# Patient Record
Sex: Female | Born: 1940 | Race: White | Hispanic: No | Marital: Married | Smoking: Former smoker
Health system: Southern US, Community
[De-identification: ages and names within clinical notes are randomized; demographics above are authoritative.]

## PROBLEM LIST (undated history)

## (undated) DIAGNOSIS — I82409 Acute embolism and thrombosis of unspecified deep veins of unspecified lower extremity: Secondary | ICD-10-CM

## (undated) DIAGNOSIS — E079 Disorder of thyroid, unspecified: Secondary | ICD-10-CM

## (undated) DIAGNOSIS — E7800 Pure hypercholesterolemia, unspecified: Secondary | ICD-10-CM

## (undated) DIAGNOSIS — M316 Other giant cell arteritis: Secondary | ICD-10-CM

## (undated) HISTORY — PX: ABDOMINAL HYSTERECTOMY: SHX81

## (undated) HISTORY — PX: OTHER SURGICAL HISTORY: SHX169

## (undated) HISTORY — DX: Other giant cell arteritis: M31.6

## (undated) HISTORY — PX: TOE SURGERY: SHX1073

## (undated) HISTORY — PX: MOUTH SURGERY: SHX715

## (undated) HISTORY — DX: Disorder of thyroid, unspecified: E07.9

## (undated) HISTORY — PX: SKIN CANCER DESTRUCTION: SHX778

## (undated) HISTORY — PX: KIDNEY SURGERY: SHX687

## (undated) HISTORY — DX: Pure hypercholesterolemia, unspecified: E78.00

## (undated) HISTORY — PX: BREAST BIOPSY: SHX20

---

## 1968-12-20 HISTORY — PX: NEPHRECTOMY: SHX65

## 2016-01-07 DIAGNOSIS — N952 Postmenopausal atrophic vaginitis: Secondary | ICD-10-CM | POA: Diagnosis not present

## 2016-01-07 DIAGNOSIS — I8390 Asymptomatic varicose veins of unspecified lower extremity: Secondary | ICD-10-CM | POA: Diagnosis not present

## 2016-02-26 DIAGNOSIS — Z6828 Body mass index (BMI) 28.0-28.9, adult: Secondary | ICD-10-CM | POA: Diagnosis not present

## 2016-02-26 DIAGNOSIS — I10 Essential (primary) hypertension: Secondary | ICD-10-CM | POA: Diagnosis not present

## 2016-02-26 DIAGNOSIS — E039 Hypothyroidism, unspecified: Secondary | ICD-10-CM | POA: Diagnosis not present

## 2016-03-29 DIAGNOSIS — L570 Actinic keratosis: Secondary | ICD-10-CM | POA: Diagnosis not present

## 2016-03-29 DIAGNOSIS — L821 Other seborrheic keratosis: Secondary | ICD-10-CM | POA: Diagnosis not present

## 2016-03-29 DIAGNOSIS — Z08 Encounter for follow-up examination after completed treatment for malignant neoplasm: Secondary | ICD-10-CM | POA: Diagnosis not present

## 2016-03-29 DIAGNOSIS — Z85828 Personal history of other malignant neoplasm of skin: Secondary | ICD-10-CM | POA: Diagnosis not present

## 2016-04-20 DIAGNOSIS — G4700 Insomnia, unspecified: Secondary | ICD-10-CM | POA: Diagnosis not present

## 2016-04-20 DIAGNOSIS — I10 Essential (primary) hypertension: Secondary | ICD-10-CM | POA: Diagnosis not present

## 2016-04-20 DIAGNOSIS — Z6828 Body mass index (BMI) 28.0-28.9, adult: Secondary | ICD-10-CM | POA: Diagnosis not present

## 2016-04-23 DIAGNOSIS — I10 Essential (primary) hypertension: Secondary | ICD-10-CM | POA: Diagnosis not present

## 2016-04-23 DIAGNOSIS — E039 Hypothyroidism, unspecified: Secondary | ICD-10-CM | POA: Diagnosis not present

## 2016-05-24 DIAGNOSIS — Z1211 Encounter for screening for malignant neoplasm of colon: Secondary | ICD-10-CM | POA: Diagnosis not present

## 2016-05-24 DIAGNOSIS — Z01419 Encounter for gynecological examination (general) (routine) without abnormal findings: Secondary | ICD-10-CM | POA: Diagnosis not present

## 2016-06-08 DIAGNOSIS — M25511 Pain in right shoulder: Secondary | ICD-10-CM | POA: Diagnosis not present

## 2016-06-16 DIAGNOSIS — M25511 Pain in right shoulder: Secondary | ICD-10-CM | POA: Diagnosis not present

## 2016-06-30 DIAGNOSIS — M7581 Other shoulder lesions, right shoulder: Secondary | ICD-10-CM | POA: Diagnosis not present

## 2016-07-21 DIAGNOSIS — M25511 Pain in right shoulder: Secondary | ICD-10-CM | POA: Diagnosis not present

## 2016-07-23 DIAGNOSIS — M25511 Pain in right shoulder: Secondary | ICD-10-CM | POA: Diagnosis not present

## 2016-07-27 DIAGNOSIS — M25511 Pain in right shoulder: Secondary | ICD-10-CM | POA: Diagnosis not present

## 2016-07-29 DIAGNOSIS — M25511 Pain in right shoulder: Secondary | ICD-10-CM | POA: Diagnosis not present

## 2016-08-03 DIAGNOSIS — L578 Other skin changes due to chronic exposure to nonionizing radiation: Secondary | ICD-10-CM | POA: Diagnosis not present

## 2016-08-03 DIAGNOSIS — Z85828 Personal history of other malignant neoplasm of skin: Secondary | ICD-10-CM | POA: Diagnosis not present

## 2016-08-03 DIAGNOSIS — L814 Other melanin hyperpigmentation: Secondary | ICD-10-CM | POA: Diagnosis not present

## 2016-08-03 DIAGNOSIS — L820 Inflamed seborrheic keratosis: Secondary | ICD-10-CM | POA: Diagnosis not present

## 2016-08-03 DIAGNOSIS — Z08 Encounter for follow-up examination after completed treatment for malignant neoplasm: Secondary | ICD-10-CM | POA: Diagnosis not present

## 2016-08-03 DIAGNOSIS — D225 Melanocytic nevi of trunk: Secondary | ICD-10-CM | POA: Diagnosis not present

## 2016-08-03 DIAGNOSIS — L570 Actinic keratosis: Secondary | ICD-10-CM | POA: Diagnosis not present

## 2016-08-05 DIAGNOSIS — M25511 Pain in right shoulder: Secondary | ICD-10-CM | POA: Diagnosis not present

## 2016-08-10 DIAGNOSIS — M25511 Pain in right shoulder: Secondary | ICD-10-CM | POA: Diagnosis not present

## 2016-09-14 DIAGNOSIS — L570 Actinic keratosis: Secondary | ICD-10-CM | POA: Diagnosis not present

## 2016-09-14 DIAGNOSIS — Z85828 Personal history of other malignant neoplasm of skin: Secondary | ICD-10-CM | POA: Diagnosis not present

## 2016-09-14 DIAGNOSIS — L814 Other melanin hyperpigmentation: Secondary | ICD-10-CM | POA: Diagnosis not present

## 2016-09-14 DIAGNOSIS — D225 Melanocytic nevi of trunk: Secondary | ICD-10-CM | POA: Diagnosis not present

## 2016-09-14 DIAGNOSIS — L638 Other alopecia areata: Secondary | ICD-10-CM | POA: Diagnosis not present

## 2016-09-14 DIAGNOSIS — L578 Other skin changes due to chronic exposure to nonionizing radiation: Secondary | ICD-10-CM | POA: Diagnosis not present

## 2016-09-14 DIAGNOSIS — L820 Inflamed seborrheic keratosis: Secondary | ICD-10-CM | POA: Diagnosis not present

## 2016-09-14 DIAGNOSIS — Z08 Encounter for follow-up examination after completed treatment for malignant neoplasm: Secondary | ICD-10-CM | POA: Diagnosis not present

## 2016-09-29 DIAGNOSIS — I10 Essential (primary) hypertension: Secondary | ICD-10-CM | POA: Diagnosis not present

## 2016-09-29 DIAGNOSIS — Z0000 Encounter for general adult medical examination without abnormal findings: Secondary | ICD-10-CM | POA: Diagnosis not present

## 2016-09-29 DIAGNOSIS — E039 Hypothyroidism, unspecified: Secondary | ICD-10-CM | POA: Diagnosis not present

## 2016-09-29 DIAGNOSIS — G4700 Insomnia, unspecified: Secondary | ICD-10-CM | POA: Diagnosis not present

## 2016-10-20 DIAGNOSIS — E039 Hypothyroidism, unspecified: Secondary | ICD-10-CM | POA: Diagnosis not present

## 2016-10-20 DIAGNOSIS — I10 Essential (primary) hypertension: Secondary | ICD-10-CM | POA: Diagnosis not present

## 2016-10-20 DIAGNOSIS — M8589 Other specified disorders of bone density and structure, multiple sites: Secondary | ICD-10-CM | POA: Diagnosis not present

## 2016-12-23 DIAGNOSIS — Z86718 Personal history of other venous thrombosis and embolism: Secondary | ICD-10-CM | POA: Diagnosis not present

## 2016-12-23 DIAGNOSIS — J329 Chronic sinusitis, unspecified: Secondary | ICD-10-CM | POA: Diagnosis not present

## 2016-12-23 DIAGNOSIS — G4700 Insomnia, unspecified: Secondary | ICD-10-CM | POA: Diagnosis not present

## 2016-12-23 DIAGNOSIS — R0602 Shortness of breath: Secondary | ICD-10-CM | POA: Diagnosis not present

## 2017-01-07 DIAGNOSIS — Z86718 Personal history of other venous thrombosis and embolism: Secondary | ICD-10-CM | POA: Diagnosis not present

## 2017-01-07 DIAGNOSIS — R0602 Shortness of breath: Secondary | ICD-10-CM | POA: Diagnosis not present

## 2017-02-01 DIAGNOSIS — E039 Hypothyroidism, unspecified: Secondary | ICD-10-CM | POA: Diagnosis not present

## 2017-02-01 DIAGNOSIS — I10 Essential (primary) hypertension: Secondary | ICD-10-CM | POA: Diagnosis not present

## 2017-02-01 DIAGNOSIS — G4700 Insomnia, unspecified: Secondary | ICD-10-CM | POA: Diagnosis not present

## 2017-02-01 DIAGNOSIS — Z1231 Encounter for screening mammogram for malignant neoplasm of breast: Secondary | ICD-10-CM | POA: Diagnosis not present

## 2017-03-08 DIAGNOSIS — C44519 Basal cell carcinoma of skin of other part of trunk: Secondary | ICD-10-CM | POA: Diagnosis not present

## 2017-03-08 DIAGNOSIS — Z85828 Personal history of other malignant neoplasm of skin: Secondary | ICD-10-CM | POA: Diagnosis not present

## 2017-03-08 DIAGNOSIS — Z08 Encounter for follow-up examination after completed treatment for malignant neoplasm: Secondary | ICD-10-CM | POA: Diagnosis not present

## 2017-03-08 DIAGNOSIS — L638 Other alopecia areata: Secondary | ICD-10-CM | POA: Diagnosis not present

## 2017-03-08 DIAGNOSIS — D485 Neoplasm of uncertain behavior of skin: Secondary | ICD-10-CM | POA: Diagnosis not present

## 2017-03-23 DIAGNOSIS — N898 Other specified noninflammatory disorders of vagina: Secondary | ICD-10-CM | POA: Diagnosis not present

## 2017-03-24 DIAGNOSIS — L814 Other melanin hyperpigmentation: Secondary | ICD-10-CM | POA: Diagnosis not present

## 2017-03-24 DIAGNOSIS — L570 Actinic keratosis: Secondary | ICD-10-CM | POA: Diagnosis not present

## 2017-03-24 DIAGNOSIS — D229 Melanocytic nevi, unspecified: Secondary | ICD-10-CM | POA: Diagnosis not present

## 2017-03-24 DIAGNOSIS — L638 Other alopecia areata: Secondary | ICD-10-CM | POA: Diagnosis not present

## 2017-03-24 DIAGNOSIS — C44519 Basal cell carcinoma of skin of other part of trunk: Secondary | ICD-10-CM | POA: Diagnosis not present

## 2017-03-24 DIAGNOSIS — L821 Other seborrheic keratosis: Secondary | ICD-10-CM | POA: Diagnosis not present

## 2017-04-14 DIAGNOSIS — R634 Abnormal weight loss: Secondary | ICD-10-CM | POA: Diagnosis not present

## 2017-04-14 DIAGNOSIS — E039 Hypothyroidism, unspecified: Secondary | ICD-10-CM | POA: Diagnosis not present

## 2017-04-14 DIAGNOSIS — I10 Essential (primary) hypertension: Secondary | ICD-10-CM | POA: Diagnosis not present

## 2017-04-14 DIAGNOSIS — E785 Hyperlipidemia, unspecified: Secondary | ICD-10-CM | POA: Diagnosis not present

## 2017-06-29 DIAGNOSIS — E785 Hyperlipidemia, unspecified: Secondary | ICD-10-CM | POA: Diagnosis not present

## 2017-06-29 DIAGNOSIS — G4700 Insomnia, unspecified: Secondary | ICD-10-CM | POA: Diagnosis not present

## 2017-06-29 DIAGNOSIS — E039 Hypothyroidism, unspecified: Secondary | ICD-10-CM | POA: Diagnosis not present

## 2017-06-29 DIAGNOSIS — I10 Essential (primary) hypertension: Secondary | ICD-10-CM | POA: Diagnosis not present

## 2017-07-18 DIAGNOSIS — N952 Postmenopausal atrophic vaginitis: Secondary | ICD-10-CM | POA: Diagnosis not present

## 2017-07-18 DIAGNOSIS — N898 Other specified noninflammatory disorders of vagina: Secondary | ICD-10-CM | POA: Diagnosis not present

## 2017-07-27 DIAGNOSIS — I10 Essential (primary) hypertension: Secondary | ICD-10-CM | POA: Diagnosis not present

## 2017-07-27 DIAGNOSIS — E039 Hypothyroidism, unspecified: Secondary | ICD-10-CM | POA: Diagnosis not present

## 2017-08-01 DIAGNOSIS — R109 Unspecified abdominal pain: Secondary | ICD-10-CM | POA: Diagnosis not present

## 2017-10-19 DIAGNOSIS — I499 Cardiac arrhythmia, unspecified: Secondary | ICD-10-CM | POA: Diagnosis not present

## 2017-10-19 DIAGNOSIS — E039 Hypothyroidism, unspecified: Secondary | ICD-10-CM | POA: Diagnosis not present

## 2017-10-19 DIAGNOSIS — Z90710 Acquired absence of both cervix and uterus: Secondary | ICD-10-CM | POA: Diagnosis not present

## 2017-10-19 DIAGNOSIS — E782 Mixed hyperlipidemia: Secondary | ICD-10-CM | POA: Diagnosis not present

## 2017-11-03 DIAGNOSIS — E039 Hypothyroidism, unspecified: Secondary | ICD-10-CM | POA: Diagnosis not present

## 2017-11-03 DIAGNOSIS — I10 Essential (primary) hypertension: Secondary | ICD-10-CM | POA: Diagnosis not present

## 2017-11-07 DIAGNOSIS — I10 Essential (primary) hypertension: Secondary | ICD-10-CM | POA: Diagnosis not present

## 2017-11-07 DIAGNOSIS — Z0001 Encounter for general adult medical examination with abnormal findings: Secondary | ICD-10-CM | POA: Diagnosis not present

## 2017-11-07 DIAGNOSIS — G4700 Insomnia, unspecified: Secondary | ICD-10-CM | POA: Diagnosis not present

## 2017-11-07 DIAGNOSIS — Z1331 Encounter for screening for depression: Secondary | ICD-10-CM | POA: Diagnosis not present

## 2017-11-07 DIAGNOSIS — E785 Hyperlipidemia, unspecified: Secondary | ICD-10-CM | POA: Diagnosis not present

## 2017-11-07 DIAGNOSIS — E039 Hypothyroidism, unspecified: Secondary | ICD-10-CM | POA: Diagnosis not present

## 2018-01-31 ENCOUNTER — Ambulatory Visit (INDEPENDENT_AMBULATORY_CARE_PROVIDER_SITE_OTHER): Payer: Medicare Other | Admitting: Obstetrics & Gynecology

## 2018-01-31 ENCOUNTER — Encounter: Payer: Self-pay | Admitting: Obstetrics & Gynecology

## 2018-01-31 ENCOUNTER — Encounter (INDEPENDENT_AMBULATORY_CARE_PROVIDER_SITE_OTHER): Payer: Self-pay

## 2018-01-31 VITALS — BP 142/68 | HR 71 | Ht 65.0 in | Wt 165.4 lb

## 2018-01-31 DIAGNOSIS — N952 Postmenopausal atrophic vaginitis: Secondary | ICD-10-CM | POA: Diagnosis not present

## 2018-01-31 MED ORDER — OSPEMIFENE 60 MG PO TABS: 1.0000 | ORAL_TABLET | Freq: Every day | ORAL | 11 refills | Status: DC

## 2018-01-31 NOTE — Progress Notes (Signed)
Chief Complaint  Patient presents with  . Vaginal Discharge      77 y.o. [id number] No LMP recorded. Patient has had a hysterectomy. The current method of family planning is status post hysterectomy.  Outpatient Encounter Medications as of 01/31/2018  Medication Sig  . amitriptyline (ELAVIL) 50 MG tablet Take 100 mg by mouth at bedtime.   Marland Kitchen levothyroxine (SYNTHROID, LEVOTHROID) 75 MCG tablet Take 75 mcg by mouth daily.  . metoprolol tartrate (LOPRESSOR) 50 MG tablet Take 50 mg by mouth 2 (two) times daily.  . temazepam (RESTORIL) 30 MG capsule Take 30 mg by mouth at bedtime as needed.  . Ospemifene (OSPHENA) 60 MG TABS Take 1 tablet by mouth daily.   No facility-administered encounter medications on file as of 01/31/2018.     Subjective Vaginal irritation burning No discahrge no odor No itching Increased dryness Past Medical History:  Diagnosis Date  . 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 High cholesterol   . Thyroid disease     Past Surgical History:  Procedure Laterality Date  . ABDOMINAL HYSTERECTOMY    . BREAST BIOPSY Left   . [place]  . giant cell biopsy    . KIDNEY SURGERY Left    left kidney removed  . MOUTH SURGERY    . SKIN CANCER DESTRUCTION     on back and chest  . TOE SURGERY Right    nail removed    OB History    Gravida  2   Para  2   Term  2   Preterm      AB      Living  2     SAB      TAB      Ectopic      Multiple      Live Births  2           Allergies  Allergen Reactions  . Amoxicillin Other (See Comments)    diarrhea  . Sulfur Hives    Social History   Socioeconomic History  . Marital status: Married    Spouse name: Not on file  . Number of children: Not on file  . Years of education: Not on file  . Highest education level: Not on file  Occupational History  . Not on file  Social Needs  . Financial resource strain: Not on file  . Food insecurity:    Worry: Not on file   Inability: Not on file  . Transportation needs:    Medical: Not on file    Non-medical: Not on file  Tobacco Use  . Smoking status: Former Smoker    Years: 2.00    Types: Cigarettes  . Smokeless tobacco: Never Used  Substance and Sexual Activity  . Alcohol use: No    Frequency: Never  . Drug use: No  . Sexual activity: Not Currently    Birth control/protection: Surgical    Comment: hyst  Lifestyle  . Physical activity:    Days per week: Not on file    Minutes per session: Not on file  . Stress: Not on file  Relationships  . Social connections:    Talks on phone: Not on file    Gets together: Not on file    Attends religious service: Not on file    Active member of club or organization: Not on file    Attends meetings of clubs or organizations: Not on file  Relationship status: Not on file  Other Topics Concern  . Not on file  Social History Narrative  . Not on file    Family History  Problem Relation Age of Onset  . Heart attack Paternal Grandfather   . Tuberculosis Maternal Grandfather   . Stroke Father        Stem cell  . Aneurysm Mother        brain  . Diabetes Brother   . Healthy Daughter   . Other Son        IV septal defect; pulmonary stenosis    Medications:       Current Outpatient Medications:  .  amitriptyline (ELAVIL) 50 MG tablet, Take 100 mg by mouth at bedtime. , Disp: , Rfl: 3 .  levothyroxine (SYNTHROID, LEVOTHROID) 75 MCG tablet, Take 75 mcg by mouth daily., Disp: , Rfl: 3 .  metoprolol tartrate (LOPRESSOR) 50 MG tablet, Take 50 mg by mouth 2 (two) times daily., Disp: , Rfl: 4 .  temazepam (RESTORIL) 30 MG capsule, Take 30 mg by mouth at bedtime as needed., Disp: , Rfl: 3 .  Ospemifene (OSPHENA) 60 MG TABS, Take 1 tablet by mouth daily., Disp: 30 tablet, Rfl: 11 .  terconazole (TERAZOL 7) 0.4 % vaginal cream, Place 1 applicator vaginally at bedtime., Disp: 45 g, Rfl: 0  Objective Blood pressure (!) 142/68, pulse 71, height 5\' 5"  (1.651 m),  weight 165 lb 6.4 oz (75 kg).  General WDWN female NAD Vulva:  normal appearing vulva with no masses, tenderness or lesions Vagina:  Atrophic changes, moderate Cervix:  absent Uterus:  uterus absent Adnexa: ovaries:,     Pertinent ROS No burning with urination, frequency or urgency No nausea, vomiting or diarrhea Nor fever chills or other constitutional symptoms   Labs or studies     Impression Diagnoses this Encounter::   ICD-10-CM   1. 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 Ospemifene (OSPHENA) 60 MG TABS    Sig: Take 1 tablet by mouth daily.    Dispense:  30 tablet    Refill:  11    Labs or Scans Ordered: No orders of the defined types were placed in this encounter.   Management:: osphena for atrophic changes  Follow up Return in about 6 weeks (around 03/14/2018) for Follow up, with Dr Elonda Husky.     All questions were answered.

## 2018-02-02 DIAGNOSIS — Z1283 Encounter for screening for malignant neoplasm of skin: Secondary | ICD-10-CM | POA: Diagnosis not present

## 2018-02-02 DIAGNOSIS — D225 Melanocytic nevi of trunk: Secondary | ICD-10-CM | POA: Diagnosis not present

## 2018-02-02 DIAGNOSIS — L308 Other specified dermatitis: Secondary | ICD-10-CM | POA: Diagnosis not present

## 2018-02-22 DIAGNOSIS — G4700 Insomnia, unspecified: Secondary | ICD-10-CM | POA: Diagnosis not present

## 2018-02-22 DIAGNOSIS — E785 Hyperlipidemia, unspecified: Secondary | ICD-10-CM | POA: Diagnosis not present

## 2018-02-22 DIAGNOSIS — Z6827 Body mass index (BMI) 27.0-27.9, adult: Secondary | ICD-10-CM | POA: Diagnosis not present

## 2018-02-22 DIAGNOSIS — I10 Essential (primary) hypertension: Secondary | ICD-10-CM | POA: Diagnosis not present

## 2018-02-22 DIAGNOSIS — E039 Hypothyroidism, unspecified: Secondary | ICD-10-CM | POA: Diagnosis not present

## 2018-03-14 ENCOUNTER — Encounter: Payer: Self-pay | Admitting: Obstetrics & Gynecology

## 2018-03-14 ENCOUNTER — Ambulatory Visit (INDEPENDENT_AMBULATORY_CARE_PROVIDER_SITE_OTHER): Payer: Medicare Other | Admitting: Obstetrics & Gynecology

## 2018-03-14 VITALS — BP 130/68 | HR 71 | Ht 65.0 in | Wt 165.0 lb

## 2018-03-14 DIAGNOSIS — N952 Postmenopausal atrophic vaginitis: Secondary | ICD-10-CM

## 2018-03-14 NOTE — Progress Notes (Signed)
Chief Complaint  Patient presents with  . Follow-up      77 y.o. [id number] No LMP recorded. Patient has had a hysterectomy. The current method of family planning is none.  Outpatient Encounter Medications as of 03/14/2018  Medication Sig  . amitriptyline (ELAVIL) 50 MG tablet Take 100 mg by mouth at bedtime.   Marland Kitchen levothyroxine (SYNTHROID, LEVOTHROID) 75 MCG tablet Take 75 mcg by mouth daily.  . metoprolol tartrate (LOPRESSOR) 50 MG tablet Take 50 mg by mouth 2 (two) times daily.  . Ospemifene (OSPHENA) 60 MG TABS Take 1 tablet by mouth daily.  . temazepam (RESTORIL) 30 MG capsule Take 30 mg by mouth at bedtime as needed.   No facility-administered encounter medications on file as of 03/14/2018.     Subjective Tina Brooks is in today for follow-up of her atrophic vaginitis  I saw her about 6-7 weeks ago for increasing burning bothersome discharge dryness rawness irritation of the vagina  She of course wanted to avoid any systemic or vaginal estrogen so I began her on Osphena  Since that time the patient states she has has a positive response with much less watery discharge less dryness and irritation  She denies any problems with excessive burning or itching or other side effects  She states she does still occasionally have some watery discharge but is not nearly as bad as it was and nothing else seems to make it worse at this point Past Medical History:  Diagnosis Date  . 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 High cholesterol   . Thyroid disease     Past Surgical History:  Procedure Laterality Date  . ABDOMINAL HYSTERECTOMY    . BREAST BIOPSY Left   . [place]  . giant cell biopsy    . KIDNEY SURGERY Left    left kidney removed  . MOUTH SURGERY    . SKIN CANCER DESTRUCTION     on back and chest  . TOE SURGERY Right    nail removed    OB History    Gravida  2   Para  2   Term  2   Preterm      AB      Living  2     SAB      TAB        Ectopic      Multiple      Live Births  2           Allergies  Allergen Reactions  . Amoxicillin Other (See Comments)    diarrhea  . Sulfur Hives    Social History   Socioeconomic History  . Marital status: Married    Spouse name: Not on file  . Number of children: Not on file  . Years of education: Not on file  . Highest education level: Not on file  Occupational History  . Not on file  Social Needs  . Financial resource strain: Not on file  . Food insecurity:    Worry: Not on file    Inability: Not on file  . Transportation needs:    Medical: Not on file    Non-medical: Not on file  Tobacco Use  . Smoking status: Former Smoker    Years: 2.00    Types: Cigarettes  . Smokeless tobacco: Never Used  Substance and Sexual Activity  . Alcohol use: No    Frequency: Never  . Drug use: No  .  Sexual activity: Not Currently    Birth control/protection: Surgical    Comment: hyst  Lifestyle  . Physical activity:    Days per week: Not on file    Minutes per session: Not on file  . Stress: Not on file  Relationships  . Social connections:    Talks on phone: Not on file    Gets together: Not on file    Attends religious service: Not on file    Active member of club or organization: Not on file    Attends meetings of clubs or organizations: Not on file    Relationship status: Not on file  Other Topics Concern  . Not on file  Social History Narrative  . Not on file    Family History  Problem Relation Age of Onset  . Heart attack Paternal Grandfather   . Tuberculosis Maternal Grandfather   . Stroke Father        Stem cell  . Aneurysm Mother        brain  . Diabetes Brother   . Healthy Daughter   . Other Son        IV septal defect; pulmonary stenosis    Medications:       Current Outpatient Medications:  .  amitriptyline (ELAVIL) 50 MG tablet, Take 100 mg by mouth at bedtime. , Disp: , Rfl: 3 .  levothyroxine (SYNTHROID, LEVOTHROID) 75 MCG  tablet, Take 75 mcg by mouth daily., Disp: , Rfl: 3 .  metoprolol tartrate (LOPRESSOR) 50 MG tablet, Take 50 mg by mouth 2 (two) times daily., Disp: , Rfl: 4 .  Ospemifene (OSPHENA) 60 MG TABS, Take 1 tablet by mouth daily., Disp: 30 tablet, Rfl: 11 .  temazepam (RESTORIL) 30 MG capsule, Take 30 mg by mouth at bedtime as needed., Disp: , Rfl: 3  Objective Blood pressure 130/68, pulse 71, height 5\' 5"  (1.651 m), weight 165 lb (74.8 kg).  General WDWN female NAD Vulva:  atrophic Vagina:  atrophicbut improved Cervix:  absent Uterus:  uterus absent Adnexa: ovaries:not present,     Pertinent ROS No burning with urination, frequency or urgency No nausea, vomiting or diarrhea Nor fever chills or other constitutional symptoms   Labs or studies No new    Impression Diagnoses this Encounter::   ICD-10-CM   1. 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   Labs or Scans Ordered: No orders of the defined types were placed in this encounter.   Management:: continue osphena  Follow up Return in about 1 year (around 03/15/2019) for Follow up, with Dr Elonda Husky.    All questions were answered.

## 2018-03-29 ENCOUNTER — Telehealth: Payer: Self-pay | Admitting: Obstetrics & Gynecology

## 2018-03-29 MED ORDER — TERCONAZOLE 0.4 % VA CREA: 1.0000 | TOPICAL_CREAM | Freq: Every day | VAGINAL | 0 refills | Status: DC

## 2018-03-29 NOTE — Telephone Encounter (Signed)
Informed pt that Rx for terazol vaginal cream had been sent to [facility].

## 2018-05-23 DIAGNOSIS — E039 Hypothyroidism, unspecified: Secondary | ICD-10-CM | POA: Diagnosis not present

## 2018-05-23 DIAGNOSIS — E785 Hyperlipidemia, unspecified: Secondary | ICD-10-CM | POA: Diagnosis not present

## 2018-05-26 DIAGNOSIS — Z6827 Body mass index (BMI) 27.0-27.9, adult: Secondary | ICD-10-CM | POA: Diagnosis not present

## 2018-05-26 DIAGNOSIS — I10 Essential (primary) hypertension: Secondary | ICD-10-CM | POA: Diagnosis not present

## 2018-05-26 DIAGNOSIS — E782 Mixed hyperlipidemia: Secondary | ICD-10-CM | POA: Diagnosis not present

## 2018-05-26 DIAGNOSIS — E039 Hypothyroidism, unspecified: Secondary | ICD-10-CM | POA: Diagnosis not present

## 2018-05-26 DIAGNOSIS — G4700 Insomnia, unspecified: Secondary | ICD-10-CM | POA: Diagnosis not present

## 2018-06-08 DIAGNOSIS — I10 Essential (primary) hypertension: Secondary | ICD-10-CM | POA: Diagnosis not present

## 2018-07-11 DIAGNOSIS — Z85828 Personal history of other malignant neoplasm of skin: Secondary | ICD-10-CM | POA: Diagnosis not present

## 2018-07-11 DIAGNOSIS — L570 Actinic keratosis: Secondary | ICD-10-CM | POA: Diagnosis not present

## 2018-07-11 DIAGNOSIS — L821 Other seborrheic keratosis: Secondary | ICD-10-CM | POA: Diagnosis not present

## 2018-08-30 DIAGNOSIS — L570 Actinic keratosis: Secondary | ICD-10-CM | POA: Diagnosis not present

## 2018-08-30 DIAGNOSIS — L821 Other seborrheic keratosis: Secondary | ICD-10-CM | POA: Diagnosis not present

## 2018-08-30 DIAGNOSIS — Z85828 Personal history of other malignant neoplasm of skin: Secondary | ICD-10-CM | POA: Diagnosis not present

## 2018-09-21 ENCOUNTER — Emergency Department (HOSPITAL_COMMUNITY): Payer: Medicare Other

## 2018-09-21 ENCOUNTER — Other Ambulatory Visit: Payer: Self-pay

## 2018-09-21 ENCOUNTER — Encounter (HOSPITAL_COMMUNITY): Payer: Self-pay | Admitting: Emergency Medicine

## 2018-09-21 ENCOUNTER — Emergency Department (HOSPITAL_COMMUNITY)
Admission: EM | Admit: 2018-09-21 | Discharge: 2018-09-21 | Disposition: A | Discharge status: Home / Self Care | Payer: Medicare Other | Attending: Emergency Medicine | Admitting: Emergency Medicine

## 2018-09-21 DIAGNOSIS — Z87891 Personal history of nicotine dependence: Secondary | ICD-10-CM | POA: Diagnosis not present

## 2018-09-21 DIAGNOSIS — M79604 Pain in right leg: Secondary | ICD-10-CM | POA: Insufficient documentation

## 2018-09-21 DIAGNOSIS — Z7901 Long term (current) use of anticoagulants: Secondary | ICD-10-CM | POA: Insufficient documentation

## 2018-09-21 DIAGNOSIS — I8001 Phlebitis and thrombophlebitis of superficial vessels of right lower extremity: Secondary | ICD-10-CM | POA: Diagnosis not present

## 2018-09-21 HISTORY — DX: Acute embolism and thrombosis of unspecified deep veins of unspecified lower extremity: I82.409

## 2018-09-21 LAB — COMPREHENSIVE METABOLIC PANEL
ALT: 11 U/L (ref 0–44)
AST: 19 U/L (ref 15–41)
Albumin: 3.4 g/dL — ABNORMAL LOW (ref 3.5–5.0)
Alkaline Phosphatase: 65 U/L (ref 38–126)
Anion gap: 8 (ref 5–15)
BUN: 21 mg/dL (ref 8–23)
CO2: 25 mmol/L (ref 22–32)
CREATININE: 1.06 mg/dL — AB (ref 0.44–1.00)
Calcium: 8.9 mg/dL (ref 8.9–10.3)
Chloride: 101 mmol/L (ref 98–111)
GFR calc Af Amer: 58 mL/min — ABNORMAL LOW (ref >60)
GFR, EST NON AFRICAN AMERICAN: 50 mL/min — AB (ref >60)
Glucose, Bld: 138 mg/dL — ABNORMAL HIGH (ref 70–99)
Potassium: 3.7 mmol/L (ref 3.5–5.1)
Sodium: 134 mmol/L — ABNORMAL LOW (ref 135–145)
TOTAL PROTEIN: 7.1 g/dL (ref 6.5–8.1)
Total Bilirubin: 0.3 mg/dL (ref 0.3–1.2)

## 2018-09-21 LAB — CBC WITH DIFFERENTIAL/PLATELET
BASOS ABS: 0 10*3/uL (ref 0.0–0.1)
Basophils Relative: 1 %
EOS PCT: 3 %
Eosinophils Absolute: 0.3 10*3/uL (ref 0.0–0.7)
HCT: 37.6 % (ref 36.0–46.0)
Hemoglobin: 12.1 g/dL (ref 12.0–15.0)
LYMPHS ABS: 3 10*3/uL (ref 0.7–4.0)
Lymphocytes Relative: 39 %
MCH: 29.5 pg (ref 26.0–34.0)
MCHC: 32.2 g/dL (ref 30.0–36.0)
MCV: 91.7 fL (ref 78.0–100.0)
Monocytes Absolute: 0.8 10*3/uL (ref 0.1–1.0)
Monocytes Relative: 11 %
Neutro Abs: 3.6 10*3/uL (ref 1.7–7.7)
Neutrophils Relative %: 46 %
PLATELETS: 283 10*3/uL (ref 150–400)
RBC: 4.1 MIL/uL (ref 3.87–5.11)
RDW: 14.2 % (ref 11.5–15.5)
WBC: 7.7 10*3/uL (ref 4.0–10.5)

## 2018-09-21 LAB — PROTIME-INR
INR: 1.04
PROTHROMBIN TIME: 13.5 s (ref 11.4–15.2)

## 2018-09-21 NOTE — ED Triage Notes (Signed)
Pt reports possible DVT to RLE. C/o increase in pain and swelling x 4 days. Hx of blood clot to same extremity and reports that it feels similar to when she had one before. Pt currently not on blood thinners.

## 2018-09-21 NOTE — ED Provider Notes (Signed)
Emergency Department Provider Note   I have reviewed the triage vital signs and the nursing notes.   HISTORY  Chief Complaint Leg Problem   HPI Jazlyn Tippens is a 77 y.o. 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  Symptoms have been progressively worsening over the past 4 days.  She reports history of blood clot in the same extremity which felt similar.  She had been on Eliquis for that and the DVT was thought to be provoked by estrogen therapy.  Patient believes she is still taking estrogen and is not completely sure.  She denies any injury to the leg.  No chest pain or shortness of breath.  Denies any fevers or chills.  She does have some pain behind the right knee.  No history of intracranial hemorrhage or GI bleeding.  No complications from Eliquis in the past.  No history of frequent falls.    Past Medical History:  Diagnosis Date  . 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 High cholesterol   . Thyroid disease     There are no active problems to display for this patient.   Past Surgical History:  Procedure Laterality Date  . ABDOMINAL HYSTERECTOMY    . BREAST BIOPSY Left   . [place]  . giant cell biopsy    . KIDNEY SURGERY Left    left kidney removed  . MOUTH SURGERY    . SKIN CANCER DESTRUCTION     on back and chest  . TOE SURGERY Right    nail removed    Allergies Amoxicillin and Sulfur  Family History  Problem Relation Age of Onset  . Heart attack Paternal Grandfather   . Tuberculosis Maternal Grandfather   . Stroke Father        Stem cell  . Aneurysm Mother        brain  . Diabetes Brother   . Healthy Daughter   . Other Son        IV septal defect; pulmonary stenosis    Social History Social History   Tobacco Use  . Smoking status: Former Smoker    Years: 2.00    Types: Cigarettes  . Smokeless tobacco: Never Used    Substance Use Topics  . Alcohol use: No    Frequency: Never  . Drug use: No    Review of Systems  Constitutional: No fever/chills Eyes: No visual changes. ENT: No sore throat. Cardiovascular: Denies chest pain. Respiratory: Denies shortness of breath. Gastrointestinal: No abdominal pain.  No nausea, no vomiting.  No diarrhea.  No constipation. Genitourinary: Negative for dysuria. Musculoskeletal: Positive right leg pain and swelling.  Skin: Negative for rash. Neurological: Negative for headaches, focal weakness or numbness.  10-point ROS otherwise negative.  ____________________________________________   PHYSICAL EXAM:  VITAL SIGNS: ED Triage Vitals  Enc Vitals Group     BP 09/21/18 1421 (!) 153/56     Pulse Rate 09/21/18 1421 78     Resp 09/21/18 1421 18     Temp 09/21/18 1421 98.2 F (36.8 C)     Temp Source 09/21/18 1421 Oral     SpO2 09/21/18 1421 95 %     Weight 09/21/18 1422 170 lb (77.1 kg)     Height 09/21/18 1422 5\' 6"  (1.676 m)     Pain Score 09/21/18 1422 5  Constitutional: Alert and oriented. Well appearing and in no acute distress. Eyes: Conjunctivae are normal.  Head: Atraumatic. Nose: No congestion/rhinnorhea. Mouth/Throat: Mucous membranes are moist. Neck: No stridor.  Cardiovascular: Normal rate, regular rhythm. Good peripheral circulation. Grossly normal heart sounds.   Respiratory: Normal respiratory effort.  No retractions. Lungs CTAB. Gastrointestinal: Soft and nontender. No distention.  Musculoskeletal: Mild right popliteal tenderness to palpation. Dilated veins in the right leg with mild erythema. Right leg slightly larger than the left.  Neurologic:  Normal speech and language. No gross focal neurologic deficits are appreciated.  Skin:  Skin is warm, dry and intact. No rash noted.  ____________________________________________   LABS (all labs ordered are listed, but only abnormal results are displayed)  Labs Reviewed  COMPREHENSIVE  METABOLIC PANEL - Abnormal; Notable for the following components:      Result Value   Sodium 134 (*)    Glucose, Bld 138 (*)    Creatinine, Ser 1.06 (*)    Albumin 3.4 (*)    GFR calc non Af Amer 50 (*)    GFR calc Af Amer 58 (*)    All other components within normal limits  CBC WITH DIFFERENTIAL/PLATELET  PROTIME-INR   ____________________________________________  RADIOLOGY  US Venous Img Lower Right (dvt Study)  Result Date: 09/21/2018 CLINICAL DATA:  Right leg pain EXAM: RIGHT LOWER EXTREMITY VENOUS DOPPLER ULTRASOUND TECHNIQUE: Gray-scale sonography with graded compression, as well as color Doppler and duplex ultrasound were performed to evaluate the lower extremity deep venous systems from the level of the common femoral vein and including the common femoral, femoral, profunda femoral, popliteal and calf veins including the posterior tibial, peroneal and gastrocnemius veins when visible. The superficial great saphenous vein was also interrogated. Spectral Doppler was utilized to evaluate flow at rest and with distal augmentation maneuvers in the common femoral, femoral and popliteal veins. COMPARISON:  None. FINDINGS: Contralateral Common Femoral Vein: Respiratory phasicity is normal and symmetric with the symptomatic side. No evidence of thrombus. Normal compressibility. Common Femoral Vein: No evidence of thrombus. Normal compressibility, respiratory phasicity and response to augmentation. Saphenofemoral Junction: No evidence of thrombus. Normal compressibility and flow on color Doppler imaging. Profunda Femoral Vein: No evidence of thrombus. Normal compressibility and flow on color Doppler imaging. Femoral Vein: No evidence of thrombus. Normal compressibility, respiratory phasicity and response to augmentation. Popliteal Vein: No evidence of thrombus. Normal compressibility, respiratory phasicity and response to augmentation. Calf Veins: No evidence of thrombus. Normal compressibility and  flow on color Doppler imaging. Superficial Great Saphenous Vein: Varicosities are noted within the greater saphenous vein below the knee with evidence of echogenic thrombus with decreased compressibility and decreased flow consistent with superficial thrombophlebitis. Venous Reflux:  None. Other Findings:  None. IMPRESSION: No evidence of deep venous thrombosis. Superficial thrombophlebitis is noted within the greater saphenous vein and its varicosities. Electronically Signed   By: Inez Catalina M.D.   On: 09/21/2018 15:20    ____________________________________________   PROCEDURES  Procedure(s) performed:   Procedures  None ____________________________________________   INITIAL IMPRESSION / ASSESSMENT AND PLAN / ED COURSE  Pertinent labs & imaging results that were available during my care of the patient were reviewed by me and considered in my medical decision making (see chart for details).  She with prior history of DVT, not on anticoagulation currently, presents to the emergency department with right leg pain and swelling.  Symptoms are similar to her prior DVT.  Will obtain ultrasound the right lower extremity along with baseline  labs. Exam not consistent with cellulitis.   [place] negative for DVT. Does have some superficial thrombophlebitis. Labs reviewed and negative. Plan for Tylenol for pain and warm compress. Patient to f/u with PCP.   At this time, I do not feel there is any life-threatening condition present. I have reviewed and discussed all results (EKG, imaging, lab, urine as appropriate), exam findings with patient. I have reviewed nursing notes and appropriate previous records.  I feel the patient is safe to be discharged home without further emergent workup. Discussed usual and customary return precautions. Patient and family (if present) verbalize understanding and are comfortable with this plan.  Patient will follow-up with their primary care provider. If they do not have a  primary care provider, information for follow-up has been provided to them. All questions have been answered.  ____________________________________________  FINAL CLINICAL IMPRESSION(S) / ED DIAGNOSES  Final diagnoses:  Right leg pain  Thrombophlebitis of superficial veins of right lower extremity    Note:  This document was prepared using Dragon voice recognition software and may include unintentional dictation errors.  Nanda Quinton, MD Emergency Medicine    Niccolo Burggraf, [facility], MD 09/21/18 [phone]

## 2018-09-21 NOTE — Discharge Instructions (Signed)
You were seen in the ED today with right leg pain. There is no evidence of DVT which is great news. You can take Tylenol for pain and apply a warm compress to the leg. Return to your PCP for further evaluation. Return to the ED with any chest pain, shortness or breath, worsening leg redness, or fever.

## 2018-11-24 DIAGNOSIS — M316 Other giant cell arteritis: Secondary | ICD-10-CM | POA: Diagnosis not present

## 2018-11-24 DIAGNOSIS — Z961 Presence of intraocular lens: Secondary | ICD-10-CM | POA: Diagnosis not present

## 2018-11-24 DIAGNOSIS — H26491 Other secondary cataract, right eye: Secondary | ICD-10-CM | POA: Diagnosis not present

## 2018-11-24 DIAGNOSIS — H43811 Vitreous degeneration, right eye: Secondary | ICD-10-CM | POA: Diagnosis not present

## 2018-12-27 DIAGNOSIS — L570 Actinic keratosis: Secondary | ICD-10-CM | POA: Diagnosis not present

## 2018-12-27 DIAGNOSIS — D485 Neoplasm of uncertain behavior of skin: Secondary | ICD-10-CM | POA: Diagnosis not present

## 2019-01-05 DIAGNOSIS — I10 Essential (primary) hypertension: Secondary | ICD-10-CM | POA: Diagnosis not present

## 2019-01-05 DIAGNOSIS — E039 Hypothyroidism, unspecified: Secondary | ICD-10-CM | POA: Diagnosis not present

## 2019-01-05 DIAGNOSIS — E785 Hyperlipidemia, unspecified: Secondary | ICD-10-CM | POA: Diagnosis not present

## 2019-01-05 DIAGNOSIS — E782 Mixed hyperlipidemia: Secondary | ICD-10-CM | POA: Diagnosis not present

## 2019-01-11 DIAGNOSIS — G4700 Insomnia, unspecified: Secondary | ICD-10-CM | POA: Diagnosis not present

## 2019-01-11 DIAGNOSIS — I10 Essential (primary) hypertension: Secondary | ICD-10-CM | POA: Diagnosis not present

## 2019-01-11 DIAGNOSIS — E039 Hypothyroidism, unspecified: Secondary | ICD-10-CM | POA: Diagnosis not present

## 2019-01-11 DIAGNOSIS — Z0001 Encounter for general adult medical examination with abnormal findings: Secondary | ICD-10-CM | POA: Diagnosis not present

## 2019-01-11 DIAGNOSIS — E782 Mixed hyperlipidemia: Secondary | ICD-10-CM | POA: Diagnosis not present

## 2019-04-17 DIAGNOSIS — E039 Hypothyroidism, unspecified: Secondary | ICD-10-CM | POA: Diagnosis not present

## 2019-04-17 DIAGNOSIS — G4700 Insomnia, unspecified: Secondary | ICD-10-CM | POA: Diagnosis not present

## 2019-04-23 DIAGNOSIS — Z85828 Personal history of other malignant neoplasm of skin: Secondary | ICD-10-CM | POA: Diagnosis not present

## 2019-04-23 DIAGNOSIS — L570 Actinic keratosis: Secondary | ICD-10-CM | POA: Diagnosis not present

## 2019-06-11 DIAGNOSIS — R197 Diarrhea, unspecified: Secondary | ICD-10-CM | POA: Diagnosis not present

## 2019-07-17 DIAGNOSIS — E782 Mixed hyperlipidemia: Secondary | ICD-10-CM | POA: Diagnosis not present

## 2019-07-17 DIAGNOSIS — I10 Essential (primary) hypertension: Secondary | ICD-10-CM | POA: Diagnosis not present

## 2019-07-17 DIAGNOSIS — E785 Hyperlipidemia, unspecified: Secondary | ICD-10-CM | POA: Diagnosis not present

## 2019-07-17 DIAGNOSIS — E039 Hypothyroidism, unspecified: Secondary | ICD-10-CM | POA: Diagnosis not present

## 2019-07-19 DIAGNOSIS — R944 Abnormal results of kidney function studies: Secondary | ICD-10-CM | POA: Diagnosis not present

## 2019-07-19 DIAGNOSIS — I10 Essential (primary) hypertension: Secondary | ICD-10-CM | POA: Diagnosis not present

## 2019-07-19 DIAGNOSIS — E782 Mixed hyperlipidemia: Secondary | ICD-10-CM | POA: Diagnosis not present

## 2019-07-19 DIAGNOSIS — E039 Hypothyroidism, unspecified: Secondary | ICD-10-CM | POA: Diagnosis not present

## 2019-07-19 DIAGNOSIS — G4700 Insomnia, unspecified: Secondary | ICD-10-CM | POA: Diagnosis not present

## 2019-07-19 DIAGNOSIS — Z0001 Encounter for general adult medical examination with abnormal findings: Secondary | ICD-10-CM | POA: Diagnosis not present

## 2019-08-07 DIAGNOSIS — I10 Essential (primary) hypertension: Secondary | ICD-10-CM | POA: Diagnosis not present

## 2019-08-07 DIAGNOSIS — E782 Mixed hyperlipidemia: Secondary | ICD-10-CM | POA: Diagnosis not present

## 2019-08-07 DIAGNOSIS — E039 Hypothyroidism, unspecified: Secondary | ICD-10-CM | POA: Diagnosis not present

## 2019-08-07 DIAGNOSIS — G4700 Insomnia, unspecified: Secondary | ICD-10-CM | POA: Diagnosis not present

## 2019-08-15 DIAGNOSIS — R300 Dysuria: Secondary | ICD-10-CM | POA: Diagnosis not present

## 2019-08-15 DIAGNOSIS — F411 Generalized anxiety disorder: Secondary | ICD-10-CM | POA: Diagnosis not present

## 2019-08-15 DIAGNOSIS — G4700 Insomnia, unspecified: Secondary | ICD-10-CM | POA: Diagnosis not present

## 2019-08-31 DIAGNOSIS — F411 Generalized anxiety disorder: Secondary | ICD-10-CM | POA: Diagnosis not present

## 2019-08-31 DIAGNOSIS — G4700 Insomnia, unspecified: Secondary | ICD-10-CM | POA: Diagnosis not present

## 2019-09-03 DIAGNOSIS — L570 Actinic keratosis: Secondary | ICD-10-CM | POA: Diagnosis not present

## 2019-09-03 DIAGNOSIS — L853 Xerosis cutis: Secondary | ICD-10-CM | POA: Diagnosis not present

## 2019-09-03 DIAGNOSIS — D485 Neoplasm of uncertain behavior of skin: Secondary | ICD-10-CM | POA: Diagnosis not present

## 2019-09-03 DIAGNOSIS — L821 Other seborrheic keratosis: Secondary | ICD-10-CM | POA: Diagnosis not present

## 2019-09-03 DIAGNOSIS — Z85828 Personal history of other malignant neoplasm of skin: Secondary | ICD-10-CM | POA: Diagnosis not present

## 2019-09-18 DIAGNOSIS — F419 Anxiety disorder, unspecified: Secondary | ICD-10-CM | POA: Diagnosis not present

## 2019-09-27 DIAGNOSIS — L821 Other seborrheic keratosis: Secondary | ICD-10-CM | POA: Diagnosis not present

## 2019-11-29 DIAGNOSIS — H43811 Vitreous degeneration, right eye: Secondary | ICD-10-CM | POA: Diagnosis not present

## 2019-11-29 DIAGNOSIS — Z961 Presence of intraocular lens: Secondary | ICD-10-CM | POA: Diagnosis not present

## 2019-11-29 DIAGNOSIS — H26493 Other secondary cataract, bilateral: Secondary | ICD-10-CM | POA: Diagnosis not present

## 2019-11-29 DIAGNOSIS — H35071 Retinal telangiectasis, right eye: Secondary | ICD-10-CM | POA: Diagnosis not present

## 2020-01-14 DIAGNOSIS — E785 Hyperlipidemia, unspecified: Secondary | ICD-10-CM | POA: Diagnosis not present

## 2020-01-14 DIAGNOSIS — E039 Hypothyroidism, unspecified: Secondary | ICD-10-CM | POA: Diagnosis not present

## 2020-01-14 DIAGNOSIS — I10 Essential (primary) hypertension: Secondary | ICD-10-CM | POA: Diagnosis not present

## 2020-01-14 DIAGNOSIS — E782 Mixed hyperlipidemia: Secondary | ICD-10-CM | POA: Diagnosis not present

## 2020-01-30 DIAGNOSIS — E039 Hypothyroidism, unspecified: Secondary | ICD-10-CM | POA: Diagnosis not present

## 2020-01-30 DIAGNOSIS — R944 Abnormal results of kidney function studies: Secondary | ICD-10-CM | POA: Diagnosis not present

## 2020-01-30 DIAGNOSIS — E782 Mixed hyperlipidemia: Secondary | ICD-10-CM | POA: Diagnosis not present

## 2020-01-30 DIAGNOSIS — G4700 Insomnia, unspecified: Secondary | ICD-10-CM | POA: Diagnosis not present

## 2020-01-30 DIAGNOSIS — I10 Essential (primary) hypertension: Secondary | ICD-10-CM | POA: Diagnosis not present

## 2020-02-26 DIAGNOSIS — I10 Essential (primary) hypertension: Secondary | ICD-10-CM | POA: Diagnosis not present

## 2020-02-26 DIAGNOSIS — Z0000 Encounter for general adult medical examination without abnormal findings: Secondary | ICD-10-CM | POA: Diagnosis not present

## 2020-02-26 DIAGNOSIS — Z712 Person consulting for explanation of examination or test findings: Secondary | ICD-10-CM | POA: Diagnosis not present

## 2020-02-26 DIAGNOSIS — R944 Abnormal results of kidney function studies: Secondary | ICD-10-CM | POA: Diagnosis not present

## 2020-02-26 DIAGNOSIS — G4700 Insomnia, unspecified: Secondary | ICD-10-CM | POA: Diagnosis not present

## 2020-02-26 DIAGNOSIS — Z124 Encounter for screening for malignant neoplasm of cervix: Secondary | ICD-10-CM | POA: Diagnosis not present

## 2020-02-26 DIAGNOSIS — E782 Mixed hyperlipidemia: Secondary | ICD-10-CM | POA: Diagnosis not present

## 2020-02-26 DIAGNOSIS — E785 Hyperlipidemia, unspecified: Secondary | ICD-10-CM | POA: Diagnosis not present

## 2020-02-26 DIAGNOSIS — R300 Dysuria: Secondary | ICD-10-CM | POA: Diagnosis not present

## 2020-02-26 DIAGNOSIS — F411 Generalized anxiety disorder: Secondary | ICD-10-CM | POA: Diagnosis not present

## 2020-02-26 DIAGNOSIS — Z6827 Body mass index (BMI) 27.0-27.9, adult: Secondary | ICD-10-CM | POA: Diagnosis not present

## 2020-02-26 DIAGNOSIS — E039 Hypothyroidism, unspecified: Secondary | ICD-10-CM | POA: Diagnosis not present

## 2020-03-04 DIAGNOSIS — I10 Essential (primary) hypertension: Secondary | ICD-10-CM | POA: Diagnosis not present

## 2020-03-04 DIAGNOSIS — R634 Abnormal weight loss: Secondary | ICD-10-CM | POA: Diagnosis not present

## 2020-03-04 DIAGNOSIS — E039 Hypothyroidism, unspecified: Secondary | ICD-10-CM | POA: Diagnosis not present

## 2020-04-07 ENCOUNTER — Emergency Department (HOSPITAL_COMMUNITY): Payer: Medicare Other

## 2020-04-07 ENCOUNTER — Other Ambulatory Visit: Payer: Self-pay

## 2020-04-07 ENCOUNTER — Emergency Department (HOSPITAL_COMMUNITY)
Admission: EM | Admit: 2020-04-07 | Discharge: 2020-04-07 | Disposition: A | Discharge status: Home / Self Care | Payer: Medicare Other | Attending: Emergency Medicine | Admitting: Emergency Medicine

## 2020-04-07 ENCOUNTER — Encounter (HOSPITAL_COMMUNITY): Payer: Self-pay

## 2020-04-07 DIAGNOSIS — Z87891 Personal history of nicotine dependence: Secondary | ICD-10-CM | POA: Insufficient documentation

## 2020-04-07 DIAGNOSIS — R079 Chest pain, unspecified: Secondary | ICD-10-CM | POA: Diagnosis not present

## 2020-04-07 DIAGNOSIS — Z79899 Other long term (current) drug therapy: Secondary | ICD-10-CM | POA: Diagnosis not present

## 2020-04-07 DIAGNOSIS — R002 Palpitations: Secondary | ICD-10-CM | POA: Diagnosis present

## 2020-04-07 DIAGNOSIS — I493 Ventricular premature depolarization: Secondary | ICD-10-CM | POA: Insufficient documentation

## 2020-04-07 LAB — COMPREHENSIVE METABOLIC PANEL
ALT: 17 U/L (ref 0–44)
AST: 21 U/L (ref 15–41)
Albumin: 3.8 g/dL (ref 3.5–5.0)
Alkaline Phosphatase: 83 U/L (ref 38–126)
Anion gap: 10 (ref 5–15)
BUN: 18 mg/dL (ref 8–23)
CO2: 24 mmol/L (ref 22–32)
Calcium: 9.1 mg/dL (ref 8.9–10.3)
Chloride: 100 mmol/L (ref 98–111)
Creatinine, Ser: 0.81 mg/dL (ref 0.44–1.00)
GFR calc Af Amer: >60 mL/min (ref >60)
GFR calc non Af Amer: >60 mL/min (ref >60)
Glucose, Bld: 114 mg/dL — ABNORMAL HIGH (ref 70–99)
Potassium: 4 mmol/L (ref 3.5–5.1)
Sodium: 134 mmol/L — ABNORMAL LOW (ref 135–145)
Total Bilirubin: 0.6 mg/dL (ref 0.3–1.2)
Total Protein: 6.9 g/dL (ref 6.5–8.1)

## 2020-04-07 LAB — CBC
HCT: 40.1 % (ref 36.0–46.0)
Hemoglobin: 12.9 g/dL (ref 12.0–15.0)
MCH: 29.6 pg (ref 26.0–34.0)
MCHC: 32.2 g/dL (ref 30.0–36.0)
MCV: 92 fL (ref 80.0–100.0)
Platelets: 239 10*3/uL (ref 150–400)
RBC: 4.36 MIL/uL (ref 3.87–5.11)
RDW: 13.4 % (ref 11.5–15.5)
WBC: 6.7 10*3/uL (ref 4.0–10.5)
nRBC: 0 % (ref 0.0–0.2)

## 2020-04-07 LAB — MAGNESIUM: Magnesium: 2.1 mg/dL (ref 1.7–2.4)

## 2020-04-07 LAB — TROPONIN I (HIGH SENSITIVITY): Troponin I (High Sensitivity): 5 ng/L (ref <18)

## 2020-04-07 NOTE — ED Provider Notes (Signed)
[facility] EMERGENCY DEPARTMENT Provider Note   CSN: [id number] Arrival date & time: 04/07/20  1715     History Chief Complaint  Patient presents with  . Chest Pain    Tina Brooks is a 79 y.o. female.  HPI   This patient is a very pleasant 79 year old female, she has no significant prior ischemic cardiomyopathy.  She presents to the hospital today with a complaint of palpitations with some chest discomfort.  She reports that over the last month she has had some progressively more frequent feeling of palpitations especially at night when she is getting ready to go to bed but it can happen at anytime of the day and last for seconds to minutes.  She is not having the symptoms at this time, it comes on spontaneously and is not associated with exertion or position per se.  She denies fevers chills nausea vomiting or shortness of breath.  She has no history of significant arrhythmia but states that her heart will "fibrillate" and she has been placed on metoprolol by a prior cardiologist when she lived in [place].  She is still taking the medication but feels like it is not working as well as it used to.  She has not seen a local cardiologist here.  She had a stress test many years ago which was negative.  Past Medical History:  Diagnosis Date  . 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 High cholesterol   . Thyroid disease     There are no problems to display for this patient.   Past Surgical History:  Procedure Laterality Date  . ABDOMINAL HYSTERECTOMY    . BREAST BIOPSY Left   . [place]  . giant cell biopsy    . KIDNEY SURGERY Left    left kidney removed  . MOUTH SURGERY    . SKIN CANCER DESTRUCTION     on back and chest  . TOE SURGERY Right    nail removed     OB History    Gravida  2   Para  2   Term  2   Preterm      AB      Living  2     SAB      TAB      Ectopic      Multiple      Live Births  2           Family History  Problem Relation Age of Onset  . Heart attack Paternal Grandfather   . Tuberculosis Maternal Grandfather   . Stroke Father        Stem cell  . Aneurysm Mother        brain  . Diabetes Brother   . Healthy Daughter   . Other Son        IV septal defect; pulmonary stenosis    Social History   Tobacco Use  . Smoking status: Former Smoker    Years: 2.00    Types: Cigarettes  . Smokeless tobacco: Never Used  Substance Use Topics  . Alcohol use: No  . Drug use: No    Home Medications Prior to Admission medications   Medication Sig Start Date End Date Taking? Authorizing Provider  amitriptyline (ELAVIL) 50 MG tablet Take 100 mg by mouth at bedtime.  01/24/18   [provider]  levothyroxine (SYNTHROID, LEVOTHROID) 75 MCG tablet Take  75 mcg by mouth daily. 12/19/17   [provider]  metoprolol tartrate (LOPRESSOR) 50 MG tablet Take 50 mg by mouth 2 (two) times daily. 01/24/18   [provider]  Ospemifene (OSPHENA) 60 MG TABS Take 1 tablet by mouth daily. 01/31/18   Florian Buff, MD  temazepam (RESTORIL) 30 MG capsule Take 30 mg by mouth at bedtime as needed. 01/24/18   [provider]  terconazole (TERAZOL 7) 0.4 % vaginal cream Place 1 applicator vaginally at bedtime. 03/29/18   Florian Buff, MD    Allergies    Amoxicillin and Sulfur  Review of Systems   Review of Systems  All other systems reviewed and are negative.   Physical Exam Updated Vital Signs BP (!) 171/72 (BP Location: Left Arm)   Pulse 75   Temp 97.8 F (36.6 C) (Oral)   Resp 18   Ht 1.626 m (5\' 4" )   Wt 64.9 kg   SpO2 98%   BMI 24.55 kg/m   Physical Exam Vitals and nursing note reviewed.  Constitutional:      General: She is not in acute distress.    Appearance: She is well-developed.  HENT:     Head: Normocephalic and atraumatic.     Mouth/Throat:     Pharynx: No oropharyngeal exudate.  Eyes:      General: No scleral icterus.       Right eye: No discharge.        Left eye: No discharge.     Conjunctiva/sclera: Conjunctivae normal.     Pupils: Pupils are equal, round, and reactive to light.  Neck:     Thyroid: No thyromegaly.     Vascular: No JVD.  Cardiovascular:     Rate and Rhythm: Normal rate and regular rhythm.     Heart sounds: Murmur present. No friction rub. No gallop.      Comments: Occasional PVC, soft systolic murmur Pulmonary:     Effort: Pulmonary effort is normal. No respiratory distress.     Breath sounds: Normal breath sounds. No wheezing or rales.  Abdominal:     General: Bowel sounds are normal. There is no distension.     Palpations: Abdomen is soft. There is no mass.     Tenderness: There is no abdominal tenderness.  Musculoskeletal:        General: No tenderness. Normal range of motion.     Cervical back: Normal range of motion and neck supple.  Lymphadenopathy:     Cervical: No cervical adenopathy.  Skin:    General: Skin is warm and dry.     Findings: No erythema or rash.  Neurological:     Mental Status: She is alert.     Coordination: Coordination normal.  Psychiatric:        Behavior: Behavior normal.     ED Results / Procedures / Treatments   Labs (all labs ordered are listed, but only abnormal results are displayed) Labs Reviewed  COMPREHENSIVE METABOLIC PANEL - Abnormal; Notable for the following components:      Result Value   Sodium 134 (*)    Glucose, Bld 114 (*)    All other components within normal limits  CBC  MAGNESIUM  TROPONIN I (HIGH SENSITIVITY)    EKG EKG Interpretation  Date/Time:  Monday April 07 2020 17:33:35 EDT Ventricular Rate:  69 PR Interval:    QRS Duration: 111 QT Interval:  430 QTC Calculation: 461 R Axis:   54 Text Interpretation: Sinus rhythm  Paired ventricular premature complexes Incomplete left bundle branch block Anterior Q waves, possibly due to ILBBB No old tracing to compare Confirmed by  Noemi Chapel [phone]) on 04/07/2020 5:36:02 PM   Radiology DG Chest Port 1 View  Result Date: 04/07/2020 CLINICAL DATA:  Chest pain, shortness of breath x4 weeks, palpitations EXAM: PORTABLE CHEST 1 VIEW COMPARISON:  None. FINDINGS: Lungs are clear. Right apical pleural-parenchymal scarring. No pleural effusion or pneumothorax. The heart is normal in size. IMPRESSION: No evidence of acute cardiopulmonary disease. Electronically Signed   By: Julian Hy M.D.   On: 04/07/2020 18:51    Procedures Procedures (including critical care time)  Medications Ordered in ED Medications - No data to display  ED Course  I have reviewed the triage vital signs and the nursing notes.  Pertinent labs & imaging results that were available during my care of the patient were reviewed by me and considered in my medical decision making (see chart for details).    MDM Rules/Calculators/A&P                      This patient is now well-appearing, she has occasional ectopy, blood pressure is 170/72, she is afebrile and well-appearing, the cause of this patient's intermittent arrhythmia is unclear but will check electrolytes, she may need referral to cardiology, she does not have exertional pain and has no signs of ischemia on the EKG though I do not have an old EKG to compare to.  There is signs of an incomplete left bundle branch block, no signs of ST elevation or depression.  She is symptom-free at this time.   This patient presents to the ED for concern of palpitations and chest pain, this involves an extensive number of treatment options, and is a complaint that carries with it a high risk of complications and morbidity.  The differential diagnosis includes electrolyte abnormalities, ischemia, arrhythmia, intermittent A. fib   Lab Tests:   I Ordered, reviewed, and interpreted labs, which included CBC, metabolic panel and troponin  Medicines ordered:   I ordered medication none   Imaging Studies  ordered:   I ordered imaging studies which included portable chest x-ray and  I independently visualized and interpreted imaging which showed no pneumothorax or pneumonia or abnormal mediastinum or subdiaphragmatic air  Additional history obtained:   Additional history obtained from medical record  Previous records obtained and reviewed do not yield any specific information regarding the patient's cardiac function  Consultations Obtained:   I consulted with the patient and her son at the bedside and discussed lab and imaging findings  Reevaluation:  After the interventions stated above, I reevaluated the patient and found the patient is well-appearing, no signs of arrhythmia, stable for discharge with referral to cardiology.  Critical Interventions:  . X-ray, EKG, troponin, cardiac monitoring, this patient did very well and is stable for discharge to follow-up with cardiology in the outpatient setting.  Patient and her son available at the bedside and agreeable to the plan.   Final Clinical Impression(s) / ED Diagnoses Final diagnoses:  PVC's (premature ventricular contractions)  Chest pain, unspecified type    Rx / DC Orders ED Discharge Orders    None       Noemi Chapel, MD 04/07/20 2027

## 2020-04-07 NOTE — ED Triage Notes (Signed)
Pt to er, pt reports chest pain and sob for the past 4 weeks, pt states that she takes metoprolol for her heart rhythm, states that she has been taking this and it seemed like it was helping for the first week, but now it doesn't seem to be working as well. Denies increase in pain with deep breathing, states that the pain is a little bit better when she relaxes.

## 2020-04-07 NOTE — Discharge Instructions (Signed)
Your testing today has not shown any specific abnormalities Please seek medical exam with the cardiology group listed above, call in the morning for the next available appointment.  Emergency department for severe or worsening symptoms including chest pain shortness of breath palpitations nausea vomiting or weakness

## 2020-04-16 DIAGNOSIS — M9902 Segmental and somatic dysfunction of thoracic region: Secondary | ICD-10-CM | POA: Diagnosis not present

## 2020-04-16 DIAGNOSIS — S233XXA Sprain of ligaments of thoracic spine, initial encounter: Secondary | ICD-10-CM | POA: Diagnosis not present

## 2020-04-16 DIAGNOSIS — M9903 Segmental and somatic dysfunction of lumbar region: Secondary | ICD-10-CM | POA: Diagnosis not present

## 2020-04-16 DIAGNOSIS — S134XXA Sprain of ligaments of cervical spine, initial encounter: Secondary | ICD-10-CM | POA: Diagnosis not present

## 2020-04-16 DIAGNOSIS — S335XXA Sprain of ligaments of lumbar spine, initial encounter: Secondary | ICD-10-CM | POA: Diagnosis not present

## 2020-04-16 DIAGNOSIS — M9901 Segmental and somatic dysfunction of cervical region: Secondary | ICD-10-CM | POA: Diagnosis not present

## 2020-04-21 DIAGNOSIS — S335XXA Sprain of ligaments of lumbar spine, initial encounter: Secondary | ICD-10-CM | POA: Diagnosis not present

## 2020-04-21 DIAGNOSIS — M9903 Segmental and somatic dysfunction of lumbar region: Secondary | ICD-10-CM | POA: Diagnosis not present

## 2020-04-21 DIAGNOSIS — M9902 Segmental and somatic dysfunction of thoracic region: Secondary | ICD-10-CM | POA: Diagnosis not present

## 2020-04-21 DIAGNOSIS — M9901 Segmental and somatic dysfunction of cervical region: Secondary | ICD-10-CM | POA: Diagnosis not present

## 2020-04-21 DIAGNOSIS — S233XXA Sprain of ligaments of thoracic spine, initial encounter: Secondary | ICD-10-CM | POA: Diagnosis not present

## 2020-04-21 DIAGNOSIS — S134XXA Sprain of ligaments of cervical spine, initial encounter: Secondary | ICD-10-CM | POA: Diagnosis not present

## 2020-04-23 ENCOUNTER — Ambulatory Visit (INDEPENDENT_AMBULATORY_CARE_PROVIDER_SITE_OTHER): Payer: Medicare Other | Admitting: Cardiovascular Disease

## 2020-04-23 ENCOUNTER — Other Ambulatory Visit: Payer: Self-pay

## 2020-04-23 ENCOUNTER — Encounter: Payer: Self-pay | Admitting: Cardiovascular Disease

## 2020-04-23 VITALS — BP 140/64 | HR 68 | Ht 64.0 in | Wt 136.8 lb

## 2020-04-23 DIAGNOSIS — R079 Chest pain, unspecified: Secondary | ICD-10-CM

## 2020-04-23 DIAGNOSIS — I10 Essential (primary) hypertension: Secondary | ICD-10-CM | POA: Diagnosis not present

## 2020-04-23 DIAGNOSIS — I493 Ventricular premature depolarization: Secondary | ICD-10-CM | POA: Diagnosis not present

## 2020-04-23 DIAGNOSIS — R002 Palpitations: Secondary | ICD-10-CM | POA: Diagnosis not present

## 2020-04-23 DIAGNOSIS — R011 Cardiac murmur, unspecified: Secondary | ICD-10-CM | POA: Diagnosis not present

## 2020-04-23 NOTE — Progress Notes (Signed)
CARDIOLOGY CONSULT NOTE  Patient ID: Tina Brooks MRN: [id number] DOB/AGE: 78-14-1942 78 y.o.  Admit date: (Not on file) Primary Physician: Celene Squibb, MD   Reason for Consultation: Chest pain and palpitations  HPI: Tina Brooks is a 79 y.o. female who is being seen today for the evaluation of chest pain and palpitations at the request of Celene Squibb, MD.   She was recently evaluated in the ED for chest pain and palpitations on 04/07/2020. I have personally reviewed all documentation, labs, radiographic and cardiovascular studies, and independently interpreted all ECG's.  She was previously placed on metoprolol for palpitations by her cardi[place] in [place].  BP was elevated to 171/72 with a heart rate of 75 in the ED.  Troponin, CBC, renal function, and magnesium were normal.  Chest x-ray showed no evidence of acute cardiopulmonary disease.  I personally reviewed ECG which demonstrated sinus rhythm with PVCs and nonspecific ST segment deviation.  She is here with her son, Tina Brooks, whom I have evaluated before and who I referred to a pediatric cardiologist at [facility].  She told me that she had been experiencing more palpitations over the last 6 to 8 weeks.  She was initially prescribed metoprolol for palpitations in about 2003 and has not seen a cardiologist since that time.  She felt her more recent palpitations were related to anxiety and caffeine.  She stopped drinking Coca-Cola and has avoid caffeinated beverages altogether.  Symptoms have subsided.  She does have occasional palpitations from time to time.  She did experience that when I auscultated her today.  She denies exertional chest pain and dyspnea.  She denies leg swelling, orthopnea, paroxysmal nocturnal dyspnea.  She had been experiencing significant gas buildup and then passed flatus and has felt much better since that time.  She has chronic constipation.   Allergies  Allergen  Reactions  . Amoxicillin Other (See Comments)    diarrhea  . Sulfur Hives    Current Outpatient Medications  Medication Sig Dispense Refill  . amitriptyline (ELAVIL) 25 MG tablet Takes 1/4 tab by mouth at bedtime    . levothyroxine (SYNTHROID) 88 MCG tablet Take 88 mcg by mouth daily.    . metoprolol tartrate (LOPRESSOR) 50 MG tablet Take 50 mg by mouth 2 (two) times daily.  4  . zolpidem (AMBIEN CR) 12.5 MG CR tablet Take 12.5 mg by mouth at bedtime.     No current facility-administered medications for this visit.    Past Medical History:  Diagnosis Date  . 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 High cholesterol   . Thyroid disease     Past Surgical History:  Procedure Laterality Date  . ABDOMINAL HYSTERECTOMY    . BREAST BIOPSY Left   . [place]  . giant cell biopsy    . KIDNEY SURGERY Left    left kidney removed  . MOUTH SURGERY    . SKIN CANCER DESTRUCTION     on back and chest  . TOE SURGERY Right    nail removed    Social History   Socioeconomic History  . Marital status: Married    Spouse name: Not on file  . Number of children: Not on file  . Years of education: Not on file  . Highest education level: Not on file  Occupational History  . Not on file  Tobacco Use  .  Smoking status: Former Smoker    Years: 2.00    Types: Cigarettes  . Smokeless tobacco: Never Used  Substance and Sexual Activity  . Alcohol use: No  . Drug use: No  . Sexual activity: Not Currently    Birth control/protection: Surgical    Comment: hyst  Other Topics Concern  . Not on file  Social History Narrative  . Not on file   Social Determinants of Health   Financial Resource Strain:   . Difficulty of Paying Living Expenses:   Food Insecurity:   . Worried About Charity fundraiser in the Last Year:   . Arboriculturist in the Last Year:   Transportation Needs:   . Film/video editor (Medical):   Marland Kitchen Lack of  Transportation (Non-Medical):   Physical Activity:   . Days of Exercise per Week:   . Minutes of Exercise per Session:   Stress:   . Feeling of Stress :   Social Connections:   . Frequency of Communication with Friends and Family:   . Frequency of Social Gatherings with Friends and Family:   . Attends Religious Services:   . Active Member of Clubs or Organizations:   . Attends Archivist Meetings:   Marland Kitchen Marital Status:   Intimate Partner Violence:   . Fear of Current or Ex-Partner:   . Emotionally Abused:   Marland Kitchen Physically Abused:   . Sexually Abused:      No family history of premature CAD in 1st degree relatives.  Current Meds  Medication Sig  . amitriptyline (ELAVIL) 25 MG tablet Takes 1/4 tab by mouth at bedtime  . levothyroxine (SYNTHROID) 88 MCG tablet Take 88 mcg by mouth daily.  . metoprolol tartrate (LOPRESSOR) 50 MG tablet Take 50 mg by mouth 2 (two) times daily.  Marland Kitchen zolpidem (AMBIEN CR) 12.5 MG CR tablet Take 12.5 mg by mouth at bedtime.      Review of systems complete and found to be negative unless listed above in HPI   [facility], LPN was present throughout the entirety of the encounter.  Physical exam Blood pressure 140/64, pulse 68, height 5\' 4"  (1.626 m), weight 136 lb 12.8 oz (62.1 kg), SpO2 99 %. General: NAD Neck: No JVD, no thyromegaly or thyroid nodule.  Lungs: Clear to auscultation bilaterally with normal respiratory effort. CV: Nondisplaced PMI. Regular rate and rhythm with premature contractions appreciated, normal S1/S2, no [id number], 2/6 systolic murmur over RUSB.  No peripheral edema.  No carotid bruit.    Abdomen: Soft, nontender, no distention.  Skin: Intact without lesions or rashes.  Neurologic: Alert and oriented x 3.  Psych: Normal affect. Extremities: No clubbing or cyanosis.  HEENT: Normal.   ECG: Most recent ECG reviewed.   Labs: Lab Results  Component Value Date/Time   K 4.0 04/07/2020 06:06 PM   BUN 18 04/07/2020 06:06  PM   CREATININE 0.81 04/07/2020 06:06 PM   ALT 17 04/07/2020 06:06 PM   HGB 12.9 04/07/2020 06:06 PM     Lipids: No results found for: LDLCALC, LDLDIRECT, CHOL, TRIG, HDL      ASSESSMENT AND PLAN:   1.  Chest pain: This appears to be due to gas as symptoms subsided after passing flatus.  She denies exertional chest pain and dyspnea.  No stress testing is indicated at this time.  2.  Palpitations: Currently on metoprolol 50 mg twice daily which was initially prescribed in about 2003.  PVCs noted on ECG and premature  contractions noted on auscultation. I will order a 2-D echocardiogram with Doppler to evaluate cardiac structure, function, and regional wall motion.  3.  Hypertension: BP is mildly elevated.  No changes to therapy today.  4.  Cardiac murmur: She may have some aortic valve sclerosis versus a mild degree of stenosis. I will order a 2-D echocardiogram with Doppler to evaluate cardiac structure, function, and regional wall motion.     Disposition: Follow up in 3 months virtual visit  Signed: Kate Sable, M.D., F.A.C.C.  04/23/2020, 1:35 PM

## 2020-04-23 NOTE — Patient Instructions (Addendum)

## 2020-04-23 NOTE — Addendum Note (Signed)
Addended by: Laurine Blazer on: 04/23/2020 02:30 PM   Modules accepted: Orders

## 2020-04-25 DIAGNOSIS — M9902 Segmental and somatic dysfunction of thoracic region: Secondary | ICD-10-CM | POA: Diagnosis not present

## 2020-04-25 DIAGNOSIS — M9901 Segmental and somatic dysfunction of cervical region: Secondary | ICD-10-CM | POA: Diagnosis not present

## 2020-04-25 DIAGNOSIS — M9903 Segmental and somatic dysfunction of lumbar region: Secondary | ICD-10-CM | POA: Diagnosis not present

## 2020-04-25 DIAGNOSIS — S134XXA Sprain of ligaments of cervical spine, initial encounter: Secondary | ICD-10-CM | POA: Diagnosis not present

## 2020-04-25 DIAGNOSIS — S335XXA Sprain of ligaments of lumbar spine, initial encounter: Secondary | ICD-10-CM | POA: Diagnosis not present

## 2020-04-25 DIAGNOSIS — S233XXA Sprain of ligaments of thoracic spine, initial encounter: Secondary | ICD-10-CM | POA: Diagnosis not present

## 2020-05-05 DIAGNOSIS — S134XXA Sprain of ligaments of cervical spine, initial encounter: Secondary | ICD-10-CM | POA: Diagnosis not present

## 2020-05-05 DIAGNOSIS — M9901 Segmental and somatic dysfunction of cervical region: Secondary | ICD-10-CM | POA: Diagnosis not present

## 2020-05-05 DIAGNOSIS — M9902 Segmental and somatic dysfunction of thoracic region: Secondary | ICD-10-CM | POA: Diagnosis not present

## 2020-05-05 DIAGNOSIS — S335XXA Sprain of ligaments of lumbar spine, initial encounter: Secondary | ICD-10-CM | POA: Diagnosis not present

## 2020-05-05 DIAGNOSIS — M9903 Segmental and somatic dysfunction of lumbar region: Secondary | ICD-10-CM | POA: Diagnosis not present

## 2020-05-05 DIAGNOSIS — S233XXA Sprain of ligaments of thoracic spine, initial encounter: Secondary | ICD-10-CM | POA: Diagnosis not present

## 2020-05-06 DIAGNOSIS — K219 Gastro-esophageal reflux disease without esophagitis: Secondary | ICD-10-CM | POA: Diagnosis not present

## 2020-05-06 DIAGNOSIS — F411 Generalized anxiety disorder: Secondary | ICD-10-CM | POA: Diagnosis not present

## 2020-05-06 DIAGNOSIS — J302 Other seasonal allergic rhinitis: Secondary | ICD-10-CM | POA: Diagnosis not present

## 2020-05-12 DIAGNOSIS — M9901 Segmental and somatic dysfunction of cervical region: Secondary | ICD-10-CM | POA: Diagnosis not present

## 2020-05-12 DIAGNOSIS — S233XXA Sprain of ligaments of thoracic spine, initial encounter: Secondary | ICD-10-CM | POA: Diagnosis not present

## 2020-05-12 DIAGNOSIS — S134XXA Sprain of ligaments of cervical spine, initial encounter: Secondary | ICD-10-CM | POA: Diagnosis not present

## 2020-05-12 DIAGNOSIS — M9902 Segmental and somatic dysfunction of thoracic region: Secondary | ICD-10-CM | POA: Diagnosis not present

## 2020-05-12 DIAGNOSIS — S335XXA Sprain of ligaments of lumbar spine, initial encounter: Secondary | ICD-10-CM | POA: Diagnosis not present

## 2020-05-12 DIAGNOSIS — M9903 Segmental and somatic dysfunction of lumbar region: Secondary | ICD-10-CM | POA: Diagnosis not present

## 2020-05-22 DIAGNOSIS — S134XXA Sprain of ligaments of cervical spine, initial encounter: Secondary | ICD-10-CM | POA: Diagnosis not present

## 2020-05-22 DIAGNOSIS — M9903 Segmental and somatic dysfunction of lumbar region: Secondary | ICD-10-CM | POA: Diagnosis not present

## 2020-05-22 DIAGNOSIS — S335XXA Sprain of ligaments of lumbar spine, initial encounter: Secondary | ICD-10-CM | POA: Diagnosis not present

## 2020-05-22 DIAGNOSIS — M9901 Segmental and somatic dysfunction of cervical region: Secondary | ICD-10-CM | POA: Diagnosis not present

## 2020-05-22 DIAGNOSIS — M9902 Segmental and somatic dysfunction of thoracic region: Secondary | ICD-10-CM | POA: Diagnosis not present

## 2020-05-22 DIAGNOSIS — S233XXA Sprain of ligaments of thoracic spine, initial encounter: Secondary | ICD-10-CM | POA: Diagnosis not present

## 2020-05-29 ENCOUNTER — Ambulatory Visit (INDEPENDENT_AMBULATORY_CARE_PROVIDER_SITE_OTHER): Payer: Medicare Other

## 2020-05-29 ENCOUNTER — Other Ambulatory Visit: Payer: Self-pay

## 2020-05-29 DIAGNOSIS — R011 Cardiac murmur, unspecified: Secondary | ICD-10-CM | POA: Diagnosis not present

## 2020-05-29 DIAGNOSIS — I493 Ventricular premature depolarization: Secondary | ICD-10-CM

## 2020-06-05 ENCOUNTER — Telehealth: Payer: Self-pay | Admitting: *Deleted

## 2020-06-05 NOTE — Telephone Encounter (Signed)
Pt voiced understanding - routed to pcp  

## 2020-06-05 NOTE — Telephone Encounter (Signed)
-----   Message from Arnoldo Lenis, MD sent at 06/04/2020  9:33 AM EDT ----- Echo shows heart function is nice and strong. Her aortic valve has a moderate leak, this is not severe enough to be a concern but can cause her heart mumur that Dr Raliegh Ip heard and will be monitored over time  J BrancH MD

## 2020-07-14 DIAGNOSIS — Z08 Encounter for follow-up examination after completed treatment for malignant neoplasm: Secondary | ICD-10-CM | POA: Diagnosis not present

## 2020-07-14 DIAGNOSIS — D485 Neoplasm of uncertain behavior of skin: Secondary | ICD-10-CM | POA: Diagnosis not present

## 2020-07-14 DIAGNOSIS — L821 Other seborrheic keratosis: Secondary | ICD-10-CM | POA: Diagnosis not present

## 2020-07-14 DIAGNOSIS — H61032 Chondritis of left external ear: Secondary | ICD-10-CM | POA: Diagnosis not present

## 2020-07-14 DIAGNOSIS — Z85828 Personal history of other malignant neoplasm of skin: Secondary | ICD-10-CM | POA: Diagnosis not present

## 2020-07-14 DIAGNOSIS — D229 Melanocytic nevi, unspecified: Secondary | ICD-10-CM | POA: Diagnosis not present

## 2020-07-14 DIAGNOSIS — L814 Other melanin hyperpigmentation: Secondary | ICD-10-CM | POA: Diagnosis not present

## 2020-07-15 DIAGNOSIS — K219 Gastro-esophageal reflux disease without esophagitis: Secondary | ICD-10-CM | POA: Diagnosis not present

## 2020-07-15 DIAGNOSIS — R944 Abnormal results of kidney function studies: Secondary | ICD-10-CM | POA: Diagnosis not present

## 2020-07-15 DIAGNOSIS — Z6827 Body mass index (BMI) 27.0-27.9, adult: Secondary | ICD-10-CM | POA: Diagnosis not present

## 2020-07-15 DIAGNOSIS — E782 Mixed hyperlipidemia: Secondary | ICD-10-CM | POA: Diagnosis not present

## 2020-07-15 DIAGNOSIS — R300 Dysuria: Secondary | ICD-10-CM | POA: Diagnosis not present

## 2020-07-15 DIAGNOSIS — I10 Essential (primary) hypertension: Secondary | ICD-10-CM | POA: Diagnosis not present

## 2020-07-15 DIAGNOSIS — R634 Abnormal weight loss: Secondary | ICD-10-CM | POA: Diagnosis not present

## 2020-07-15 DIAGNOSIS — F411 Generalized anxiety disorder: Secondary | ICD-10-CM | POA: Diagnosis not present

## 2020-07-15 DIAGNOSIS — G4700 Insomnia, unspecified: Secondary | ICD-10-CM | POA: Diagnosis not present

## 2020-07-15 DIAGNOSIS — E039 Hypothyroidism, unspecified: Secondary | ICD-10-CM | POA: Diagnosis not present

## 2020-07-15 DIAGNOSIS — J302 Other seasonal allergic rhinitis: Secondary | ICD-10-CM | POA: Diagnosis not present

## 2020-07-15 DIAGNOSIS — E785 Hyperlipidemia, unspecified: Secondary | ICD-10-CM | POA: Diagnosis not present

## 2020-07-23 DIAGNOSIS — E039 Hypothyroidism, unspecified: Secondary | ICD-10-CM | POA: Diagnosis not present

## 2020-07-23 DIAGNOSIS — I10 Essential (primary) hypertension: Secondary | ICD-10-CM | POA: Diagnosis not present

## 2020-07-23 DIAGNOSIS — R634 Abnormal weight loss: Secondary | ICD-10-CM | POA: Diagnosis not present

## 2020-07-29 ENCOUNTER — Telehealth: Payer: Medicare Other | Admitting: Family Medicine

## 2020-07-29 ENCOUNTER — Telehealth: Payer: Medicare Other | Admitting: Cardiovascular Disease

## 2020-09-15 DIAGNOSIS — F411 Generalized anxiety disorder: Secondary | ICD-10-CM | POA: Diagnosis not present

## 2020-09-15 DIAGNOSIS — G4700 Insomnia, unspecified: Secondary | ICD-10-CM | POA: Diagnosis not present

## 2020-10-03 DIAGNOSIS — Z6821 Body mass index (BMI) 21.0-21.9, adult: Secondary | ICD-10-CM | POA: Diagnosis not present

## 2020-10-03 DIAGNOSIS — E039 Hypothyroidism, unspecified: Secondary | ICD-10-CM | POA: Diagnosis not present

## 2020-10-03 DIAGNOSIS — G4700 Insomnia, unspecified: Secondary | ICD-10-CM | POA: Diagnosis not present

## 2020-10-03 DIAGNOSIS — E782 Mixed hyperlipidemia: Secondary | ICD-10-CM | POA: Diagnosis not present

## 2020-10-03 DIAGNOSIS — R7303 Prediabetes: Secondary | ICD-10-CM | POA: Diagnosis not present

## 2020-10-03 DIAGNOSIS — E559 Vitamin D deficiency, unspecified: Secondary | ICD-10-CM | POA: Diagnosis not present

## 2020-10-03 DIAGNOSIS — I499 Cardiac arrhythmia, unspecified: Secondary | ICD-10-CM | POA: Diagnosis not present

## 2020-10-10 DIAGNOSIS — I499 Cardiac arrhythmia, unspecified: Secondary | ICD-10-CM | POA: Diagnosis not present

## 2020-10-10 DIAGNOSIS — E7800 Pure hypercholesterolemia, unspecified: Secondary | ICD-10-CM | POA: Diagnosis not present

## 2020-10-10 DIAGNOSIS — G4700 Insomnia, unspecified: Secondary | ICD-10-CM | POA: Diagnosis not present

## 2020-10-10 DIAGNOSIS — Z6821 Body mass index (BMI) 21.0-21.9, adult: Secondary | ICD-10-CM | POA: Diagnosis not present

## 2020-10-10 DIAGNOSIS — R7303 Prediabetes: Secondary | ICD-10-CM | POA: Diagnosis not present

## 2020-10-10 DIAGNOSIS — E039 Hypothyroidism, unspecified: Secondary | ICD-10-CM | POA: Diagnosis not present

## 2020-10-30 DIAGNOSIS — I351 Nonrheumatic aortic (valve) insufficiency: Secondary | ICD-10-CM | POA: Diagnosis not present

## 2020-10-30 DIAGNOSIS — I77810 Thoracic aortic ectasia: Secondary | ICD-10-CM | POA: Diagnosis not present

## 2020-10-30 DIAGNOSIS — I77819 Aortic ectasia, unspecified site: Secondary | ICD-10-CM | POA: Diagnosis not present

## 2020-10-30 DIAGNOSIS — I082 Rheumatic disorders of both aortic and tricuspid valves: Secondary | ICD-10-CM | POA: Diagnosis not present

## 2020-10-30 DIAGNOSIS — I517 Cardiomegaly: Secondary | ICD-10-CM | POA: Diagnosis not present

## 2020-10-30 DIAGNOSIS — R0602 Shortness of breath: Secondary | ICD-10-CM | POA: Diagnosis not present

## 2020-11-06 DIAGNOSIS — Z1331 Encounter for screening for depression: Secondary | ICD-10-CM | POA: Diagnosis not present

## 2020-11-06 DIAGNOSIS — E039 Hypothyroidism, unspecified: Secondary | ICD-10-CM | POA: Diagnosis not present

## 2020-11-06 DIAGNOSIS — I499 Cardiac arrhythmia, unspecified: Secondary | ICD-10-CM | POA: Diagnosis not present

## 2020-11-06 DIAGNOSIS — Z1389 Encounter for screening for other disorder: Secondary | ICD-10-CM | POA: Diagnosis not present

## 2020-11-06 DIAGNOSIS — R7303 Prediabetes: Secondary | ICD-10-CM | POA: Diagnosis not present

## 2020-11-06 DIAGNOSIS — R0600 Dyspnea, unspecified: Secondary | ICD-10-CM | POA: Diagnosis not present

## 2020-11-06 DIAGNOSIS — G4700 Insomnia, unspecified: Secondary | ICD-10-CM | POA: Diagnosis not present

## 2020-11-06 DIAGNOSIS — E7800 Pure hypercholesterolemia, unspecified: Secondary | ICD-10-CM | POA: Diagnosis not present

## 2020-11-10 NOTE — Progress Notes (Deleted)
Cardiology Office Note  Date: 11/10/2020   ID: Tina Brooks, DOB 09/27/1941, MRN [id number]  PCP:  Celene Squibb, MD  Cardiologist:  No primary care provider on file. Electrophysiologist:  None   Chief Complaint: Follow-up palpitations, cardiac murmur, PVCs, chest pain, HTN.   History of Present Illness: Tina Brooks is a 79 y.o. female with a history of palpitations, cardiac murmur, PVCs, chest pain, hypertension  Last encounter with Dr. Bronson Ing 04/23/2020.  She had recent been evaluated in emergency room for chest pain and palpitations on 04/07/2020.  Previously placed on metoprolol for palpitations by previous cardiologist.  Blood pressure was elevated at 171/72.  Troponin, CBC, renal function, magnesium were normal.  Chest x-ray showed no evidence of acute cardiopulmonary disease.  She had felt her most recent palpitations were related to anxiety and caffeine.  Drinking alcohol and avoided caffeinated beverages.  Symptoms had since subsided.  She denied any chest pain or dyspnea, lower extremity edema, PND, orthopnea.  She was continuing metoprolol 50 mg daily.  2D echocardiogram was ordered.  Past Medical History:  Diagnosis Date  . 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 High cholesterol   . Thyroid disease     Past Surgical History:  Procedure Laterality Date  . ABDOMINAL HYSTERECTOMY    . BREAST BIOPSY Left   . [place]  . giant cell biopsy    . KIDNEY SURGERY Left    left kidney removed  . MOUTH SURGERY    . SKIN CANCER DESTRUCTION     on back and chest  . TOE SURGERY Right    nail removed    Current Outpatient Medications  Medication Sig Dispense Refill  . amitriptyline (ELAVIL) 25 MG tablet Takes 1/4 tab by mouth at bedtime    . levothyroxine (SYNTHROID) 88 MCG tablet Take 88 mcg by mouth daily.    . metoprolol tartrate (LOPRESSOR) 50 MG tablet Take 50 mg by mouth 2 (two) times daily.  4  .  zolpidem (AMBIEN CR) 12.5 MG CR tablet Take 12.5 mg by mouth at bedtime.     No current facility-administered medications for this visit.   Allergies:  Amoxicillin and Sulfur   Social History: The patient  reports that she has quit smoking. Her smoking use included cigarettes. She quit after 2.00 years of use. She has never used smokeless tobacco. She reports that she does not drink alcohol and does not use drugs.   Family History: The patient's family history includes Aneurysm in her mother; Diabetes in her brother; Healthy in her daughter; Heart attack in her paternal grandfather; Other in her son; Stroke in her father; Tuberculosis in her maternal grandfather.   ROS:  Please see the history of present illness. Otherwise, complete review of systems is positive for {NONE DEFAULTED:18576::"none"}.  All other systems are reviewed and negative.   Physical Exam: VS:  There were no vitals taken for this visit., BMI There is no height or weight on file to calculate BMI.  Wt Readings from Last 3 Encounters:  04/23/20 136 lb 12.8 oz (62.1 kg)  04/07/20 143 lb (64.9 kg)  09/21/18 170 lb (77.1 kg)    General: Patient appears comfortable at rest. HEENT: Conjunctiva and lids normal, oropharynx clear with moist mucosa. Neck: Supple, no elevated JVP or carotid bruits, no thyromegaly. Lungs: Clear to auscultation, nonlabored breathing at rest. Cardiac: Regular rate and rhythm, no S3  or significant systolic murmur, no pericardial rub. Abdomen: Soft, nontender, no hepatomegaly, bowel sounds present, no guarding or rebound. Extremities: No pitting edema, distal pulses 2+. Skin: Warm and dry. Musculoskeletal: No kyphosis. Neuropsychiatric: Alert and oriented x3, affect grossly appropriate.  ECG:  {EKG/Telemetry Strips Reviewed:[id number]}  Recent Labwork: 04/07/2020: ALT 17; AST 21; BUN 18; Creatinine, Ser 0.81; Hemoglobin 12.9; Magnesium 2.1; Platelets 239; Potassium 4.0; Sodium 134  No results  found for: CHOL, TRIG, HDL, CHOLHDL, VLDL, LDLCALC, LDLDIRECT  Other Studies Reviewed Today:  Echocardiogram 05/29/2020  1. Left ventricular ejection fraction, by estimation, is 55 to 60%. The left ventricle has normal function. The left ventricle has no regional wall motion abnormalities. Left ventricular diastolic parameters are indeterminate. 2. Right ventricular systolic function is normal. The right ventricular size is normal. There is normal pulmonary artery systolic pressure. 3. The mitral valve is normal in structure. No evidence of mitral valve regurgitation. No evidence of mitral stenosis. 4. The aortic valve has an indeterminant number of cusps. 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 Mild to moderate aortic valve sclerosis/calcification is present, without any evidence of aortic stenosis. 5. Aortic dilatation noted. 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 6. The inferior vena cava is normal in size with greater than 50% respiratory variability, suggesting right atrial pressure of 3 mmHg.  Assessment and Plan:  1. Chest pain, unspecified type   2. Palpitations   3. Essential hypertension   4. Cardiac murmur    1. Chest pain, unspecified type ***  2. Palpitations ***  3. Essential hypertension ***  4. Cardiac murmur ***  Medication Adjustments/Labs and Tests Ordered: Current medicines are reviewed at length with the patient today.  Concerns regarding medicines are outlined above.   Disposition: Follow-up with ***  Signed, Tina July, Tina Brooks 11/10/2020 12:23 PM    [facility] [address], [place], [place] [address] Phone: [phone]; Fax: [phone]

## 2020-11-11 ENCOUNTER — Ambulatory Visit: Payer: Medicare Other | Admitting: Family Medicine

## 2020-11-11 ENCOUNTER — Encounter: Payer: Self-pay | Admitting: Family Medicine

## 2020-11-11 DIAGNOSIS — R079 Chest pain, unspecified: Secondary | ICD-10-CM

## 2020-11-11 DIAGNOSIS — R011 Cardiac murmur, unspecified: Secondary | ICD-10-CM

## 2020-11-11 DIAGNOSIS — R002 Palpitations: Secondary | ICD-10-CM

## 2020-11-11 DIAGNOSIS — I10 Essential (primary) hypertension: Secondary | ICD-10-CM

## 2020-11-20 DIAGNOSIS — G4700 Insomnia, unspecified: Secondary | ICD-10-CM | POA: Diagnosis not present

## 2020-11-20 DIAGNOSIS — R7303 Prediabetes: Secondary | ICD-10-CM | POA: Diagnosis not present

## 2020-11-20 DIAGNOSIS — I499 Cardiac arrhythmia, unspecified: Secondary | ICD-10-CM | POA: Diagnosis not present

## 2020-11-20 DIAGNOSIS — Z6821 Body mass index (BMI) 21.0-21.9, adult: Secondary | ICD-10-CM | POA: Diagnosis not present

## 2020-11-20 DIAGNOSIS — R0600 Dyspnea, unspecified: Secondary | ICD-10-CM | POA: Diagnosis not present

## 2020-11-20 DIAGNOSIS — E039 Hypothyroidism, unspecified: Secondary | ICD-10-CM | POA: Diagnosis not present

## 2020-11-20 DIAGNOSIS — E7800 Pure hypercholesterolemia, unspecified: Secondary | ICD-10-CM | POA: Diagnosis not present

## 2020-11-20 NOTE — Progress Notes (Signed)
Cardiology Office Note  Date: 11/21/2020   ID: Tina Brooks, DOB Oct 23, 1941, MRN [id number]  PCP:  [place] Nation, MD  Cardiologist:  No primary care provider on file. Electrophysiologist:  None   Chief Complaint: Follow-up palpitations, cardiac murmur, PVCs, chest pain, HTN.   History of Present Illness: Tina Brooks is a 79 y.o. female with a history of palpitations, cardiac murmur, PVCs, chest pain, hypertension  Last encounter with Dr. Bronson Ing 04/23/2020.  She had recent been evaluated in emergency room for chest pain and palpitations on 04/07/2020.  Previously placed on metoprolol for palpitations by prior cardiologist.  Blood pressure was elevated at 171/72.  Troponin, CBC, renal function, magnesium were normal.  Chest x-ray showed no evidence of acute cardiopulmonary disease.  She had felt her most recent palpitations were related to anxiety and caffeine.  She stopped drinking Coca-Cola and avoided caffeinated beverages.  Symptoms had since subsided.  She denied any chest pain or dyspnea, lower extremity edema, PND, orthopnea.  She was continuing metoprolol 50 mg daily.  2D echocardiogram was ordered.  She is here for 40-month follow-up.  She denies any acute illnesses or hospitalizations in the interim.  States she is established with a new doctor at [facility] Dr. Jimmye Norman.  She states she had some minor issues with upper respiratory symptoms and he prescribed an inhaler which is improved her symptoms.  She states some of this symptoms seem to be related to having a cat which she may be allergic to.  She denies any significant palpitations since increasing her metoprolol.  States she is having some issues with adjusting her thyroid medications which seemed to be getting better.  She denies any anginal or exertional symptoms, orthostatic symptoms, CVA or TIA-like symptoms, PND, orthopnea, claudication, DVT or PE-like symptoms, lower extremity edema.   Past  Medical History:  Diagnosis Date  . 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 High cholesterol   . Thyroid disease     Past Surgical History:  Procedure Laterality Date  . ABDOMINAL HYSTERECTOMY    . BREAST BIOPSY Left   . [place]  . giant cell biopsy    . KIDNEY SURGERY Left    left kidney removed  . MOUTH SURGERY    . SKIN CANCER DESTRUCTION     on back and chest  . TOE SURGERY Right    nail removed    Current Outpatient Medications  Medication Sig Dispense Refill  . albuterol (VENTOLIN HFA) 108 (90 Base) MCG/ACT inhaler Inhale 2 puffs into the lungs in the morning, at noon, in the evening, and at bedtime.    Marland Kitchen amitriptyline (ELAVIL) 25 MG tablet Takes 1/4 tab by mouth at bedtime    . levothyroxine (SYNTHROID) 88 MCG tablet Take 88 mcg by mouth daily.    . metoprolol tartrate (LOPRESSOR) 50 MG tablet Take 50 mg by mouth 2 (two) times daily.  4  . zolpidem (AMBIEN CR) 12.5 MG CR tablet Take 6.25 mg by mouth at bedtime.      No current facility-administered medications for this visit.   Allergies:  Amoxicillin and Sulfur   Social History: The patient  reports that she has quit smoking. Her smoking use included cigarettes. She quit after 2.00 years of use. She has never used smokeless tobacco. She reports that she does not drink alcohol and does not use drugs.   Family History:  The patient's family history includes Aneurysm in her mother; Diabetes in her brother; Healthy in her daughter; Heart attack in her paternal grandfather; Other in her son; Stroke in her father; Tuberculosis in her maternal grandfather.   ROS:  Please see the history of present illness. Otherwise, complete review of systems is positive for none.  All other systems are reviewed and negative.   Physical Exam: VS:  BP 122/62   Pulse 62   Ht 5\' 4"  (1.626 m)   Wt 132 lb (59.9 kg)   SpO2 95%   BMI 22.66 kg/m , BMI Body mass index is 22.66  kg/m.  Wt Readings from Last 3 Encounters:  11/21/20 132 lb (59.9 kg)  04/23/20 136 lb 12.8 oz (62.1 kg)  04/07/20 143 lb (64.9 kg)    General: Patient appears comfortable at rest. Neck: Supple, no elevated JVP or carotid bruits, no thyromegaly. Lungs: Clear to auscultation, nonlabored breathing at rest. Cardiac: Regular rate and rhythm, no S3, mild systolic murmur best heard at right upper sternal border., no pericardial rub. Extremities: No pitting edema, distal pulses 2+. Skin: Warm and dry. Musculoskeletal: No kyphosis. Neuropsychiatric: Alert and oriented x3, affect grossly appropriate.  ECG:  EKG April 07, 2020, sinus rhythm.  Ventricular premature complexes, incomplete left bundle branch block, anterior Q waves possibly due to incomplete left bundle branch block rate of 69.  Recent Labwork: 04/07/2020: ALT 17; AST 21; BUN 18; Creatinine, Ser 0.81; Hemoglobin 12.9; Magnesium 2.1; Platelets 239; Potassium 4.0; Sodium 134  No results found for: CHOL, TRIG, HDL, CHOLHDL, VLDL, LDLCALC, LDLDIRECT  Other Studies Reviewed Today:  Echocardiogram 05/29/2020  1. Left ventricular ejection fraction, by estimation, is 55 to 60%. The left ventricle has normal function. The left ventricle has no regional wall motion abnormalities. Left ventricular diastolic parameters are indeterminate. 2. Right ventricular systolic function is normal. The right ventricular size is normal. There is normal pulmonary artery systolic pressure. 3. The mitral valve is normal in structure. No evidence of mitral valve regurgitation. No evidence of mitral stenosis. 4. The aortic valve has an indeterminant number of cusps. 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 Mild to moderate aortic valve sclerosis/calcification is present, without any evidence of aortic stenosis. 5. Aortic dilatation noted. 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 6. The inferior vena cava is normal in size with greater  than 50% respiratory variability, suggesting right atrial pressure of 3 mmHg.  Assessment and Plan:   1. Chest pain, unspecified type Patient denies any recent chest pain or other anginal symptoms/exertional symptoms.  2. Palpitations Patient states having very infrequent rare palpitations.  Continue metoprolol 50 mg p.o. twice daily.  3. Essential hypertension Blood pressure currently well controlled at 122/62 today.  Continue metoprolol 50 mg p.o. twice daily.  4. Cardiac murmur Echocardiogram May 29, 2020 EF 55 to 60%.  Aortic valve regurgitation moderate.  Medication Adjustments/Labs and Tests Ordered: Current medicines are reviewed at length with the patient today.  Concerns regarding medicines are outlined above.   Disposition: Follow-up with Dr. Domenic Polite or APP 6 months.  Signed, Levell July, NP 11/21/2020 9:17 AM    [facility] [address], [place], [place] [address] Phone: [phone]; Fax: [phone]

## 2020-11-21 ENCOUNTER — Ambulatory Visit (INDEPENDENT_AMBULATORY_CARE_PROVIDER_SITE_OTHER): Payer: Medicare Other | Admitting: Family Medicine

## 2020-11-21 ENCOUNTER — Encounter: Payer: Self-pay | Admitting: Family Medicine

## 2020-11-21 VITALS — BP 122/62 | HR 62 | Ht 64.0 in | Wt 132.0 lb

## 2020-11-21 DIAGNOSIS — R002 Palpitations: Secondary | ICD-10-CM

## 2020-11-21 DIAGNOSIS — R011 Cardiac murmur, unspecified: Secondary | ICD-10-CM | POA: Diagnosis not present

## 2020-11-21 DIAGNOSIS — I10 Essential (primary) hypertension: Secondary | ICD-10-CM

## 2020-11-21 DIAGNOSIS — R079 Chest pain, unspecified: Secondary | ICD-10-CM

## 2020-11-21 NOTE — Patient Instructions (Addendum)
Your physician wants you to follow-up in: [place] MD You will receive a reminder letter in the mail two months in advance. If you don't receive a letter, please call our office to schedule the follow-up appointment.  Your physician recommends that you continue on your current medications as directed. Please refer to the Current Medication list given to you today.  Thank you for choosing [facility]!!

## 2020-12-01 DIAGNOSIS — H524 Presbyopia: Secondary | ICD-10-CM | POA: Diagnosis not present

## 2020-12-01 DIAGNOSIS — Z961 Presence of intraocular lens: Secondary | ICD-10-CM | POA: Diagnosis not present

## 2020-12-01 DIAGNOSIS — H26493 Other secondary cataract, bilateral: Secondary | ICD-10-CM | POA: Diagnosis not present

## 2020-12-01 DIAGNOSIS — H35033 Hypertensive retinopathy, bilateral: Secondary | ICD-10-CM | POA: Diagnosis not present

## 2021-01-08 DIAGNOSIS — I499 Cardiac arrhythmia, unspecified: Secondary | ICD-10-CM | POA: Diagnosis not present

## 2021-01-08 DIAGNOSIS — Z6821 Body mass index (BMI) 21.0-21.9, adult: Secondary | ICD-10-CM | POA: Diagnosis not present

## 2021-01-08 DIAGNOSIS — R0600 Dyspnea, unspecified: Secondary | ICD-10-CM | POA: Diagnosis not present

## 2021-01-08 DIAGNOSIS — Z6822 Body mass index (BMI) 22.0-22.9, adult: Secondary | ICD-10-CM | POA: Diagnosis not present

## 2021-01-08 DIAGNOSIS — G4700 Insomnia, unspecified: Secondary | ICD-10-CM | POA: Diagnosis not present

## 2021-01-08 DIAGNOSIS — E7801 Familial hypercholesterolemia: Secondary | ICD-10-CM | POA: Diagnosis not present

## 2021-01-08 DIAGNOSIS — E039 Hypothyroidism, unspecified: Secondary | ICD-10-CM | POA: Diagnosis not present

## 2021-01-08 DIAGNOSIS — E7800 Pure hypercholesterolemia, unspecified: Secondary | ICD-10-CM | POA: Diagnosis not present

## 2021-01-08 DIAGNOSIS — R7303 Prediabetes: Secondary | ICD-10-CM | POA: Diagnosis not present

## 2021-04-09 DIAGNOSIS — G4700 Insomnia, unspecified: Secondary | ICD-10-CM | POA: Diagnosis not present

## 2021-04-09 DIAGNOSIS — Z6822 Body mass index (BMI) 22.0-22.9, adult: Secondary | ICD-10-CM | POA: Diagnosis not present

## 2021-04-09 DIAGNOSIS — R7303 Prediabetes: Secondary | ICD-10-CM | POA: Diagnosis not present

## 2021-04-09 DIAGNOSIS — I499 Cardiac arrhythmia, unspecified: Secondary | ICD-10-CM | POA: Diagnosis not present

## 2021-04-09 DIAGNOSIS — E7801 Familial hypercholesterolemia: Secondary | ICD-10-CM | POA: Diagnosis not present

## 2021-04-09 DIAGNOSIS — R0600 Dyspnea, unspecified: Secondary | ICD-10-CM | POA: Diagnosis not present

## 2021-04-09 DIAGNOSIS — E039 Hypothyroidism, unspecified: Secondary | ICD-10-CM | POA: Diagnosis not present

## 2021-04-14 DIAGNOSIS — H9209 Otalgia, unspecified ear: Secondary | ICD-10-CM | POA: Diagnosis not present

## 2021-04-14 DIAGNOSIS — H6980 Other specified disorders of Eustachian tube, unspecified ear: Secondary | ICD-10-CM | POA: Diagnosis not present

## 2021-04-23 DIAGNOSIS — Z6822 Body mass index (BMI) 22.0-22.9, adult: Secondary | ICD-10-CM | POA: Diagnosis not present

## 2021-04-23 DIAGNOSIS — R300 Dysuria: Secondary | ICD-10-CM | POA: Diagnosis not present

## 2021-04-30 DIAGNOSIS — M810 Age-related osteoporosis without current pathological fracture: Secondary | ICD-10-CM | POA: Diagnosis not present

## 2021-06-08 ENCOUNTER — Encounter: Payer: Self-pay | Admitting: Family Medicine

## 2021-06-08 ENCOUNTER — Ambulatory Visit: Payer: Medicare Other | Admitting: Cardiology

## 2021-06-08 NOTE — Progress Notes (Deleted)
Clinical Summary Ms. Stallone is a 80 y.o.female former patient of Dr Court Joy, this is our first visit  1.Chest pain -     2. Palpitations   3. HTN    4. Aortic regurgitation - moderate by echo, aortic root 98mm  Past Medical History:  Diagnosis Date   DVT (deep venous thrombosis) (HCC)    RT leg   Giant cell arteritis (HCC)    High cholesterol    Thyroid disease      Allergies  Allergen Reactions   Amoxicillin Other (See Comments)    diarrhea   Elemental Sulfur Hives     Current Outpatient Medications  Medication Sig Dispense Refill   albuterol (VENTOLIN HFA) 108 (90 Base) MCG/ACT inhaler Inhale 2 puffs into the lungs in the morning, at noon, in the evening, and at bedtime.     amitriptyline (ELAVIL) 25 MG tablet Takes 1/4 tab by mouth at bedtime     levothyroxine (SYNTHROID) 88 MCG tablet Take 88 mcg by mouth daily.     metoprolol tartrate (LOPRESSOR) 50 MG tablet Take 50 mg by mouth 2 (two) times daily.  4   zolpidem (AMBIEN CR) 12.5 MG CR tablet Take 6.25 mg by mouth at bedtime.      No current facility-administered medications for this visit.     Past Surgical History:  Procedure Laterality Date   ABDOMINAL HYSTERECTOMY     BREAST BIOPSY Left    CESAREAN SECTION  1964, 1972   giant cell biopsy     KIDNEY SURGERY Left    left kidney removed   MOUTH SURGERY     SKIN CANCER DESTRUCTION     on back and chest   TOE SURGERY Right    nail removed     Allergies  Allergen Reactions   Amoxicillin Other (See Comments)    diarrhea   Elemental Sulfur Hives      Family History  Problem Relation Age of Onset   Heart attack Paternal Grandfather    Tuberculosis Maternal Grandfather    Stroke Father        Stem cell   Aneurysm Mother        brain   Diabetes Brother    Healthy Daughter    Other Son        IV septal defect; pulmonary stenosis     Social History Ms. Gewirtz reports that she does not have a smoking history on file.  She has never used smokeless tobacco. Ms. Berryman reports no history of alcohol use.   Review of Systems CONSTITUTIONAL: No weight loss, fever, chills, weakness or fatigue.  HEENT: Eyes: No visual loss, blurred vision, double vision or yellow sclerae.No hearing loss, sneezing, congestion, runny nose or sore throat.  SKIN: No rash or itching.  CARDIOVASCULAR:  RESPIRATORY: No shortness of breath, cough or sputum.  GASTROINTESTINAL: No anorexia, nausea, vomiting or diarrhea. No abdominal pain or blood.  GENITOURINARY: No burning on urination, no polyuria NEUROLOGICAL: No headache, dizziness, syncope, paralysis, ataxia, numbness or tingling in the extremities. No change in bowel or bladder control.  MUSCULOSKELETAL: No muscle, back pain, joint pain or stiffness.  LYMPHATICS: No enlarged nodes. No history of splenectomy.  PSYCHIATRIC: No history of depression or anxiety.  ENDOCRINOLOGIC: No reports of sweating, cold or heat intolerance. No polyuria or polydipsia.  Marland Kitchen   Physical Examination There were no vitals filed for this visit. There were no vitals filed for this visit.  Gen: resting comfortably, no acute  distress HEENT: no scleral icterus, pupils equal round and reactive, no palptable cervical adenopathy,  CV Resp: Clear to auscultation bilaterally GI: abdomen is soft, non-tender, non-distended, normal bowel sounds, no hepatosplenomegaly MSK: extremities are warm, no edema.  Skin: warm, no rash Neuro:  no focal deficits Psych: appropriate affect   Diagnostic Studies  05/2020 echo IMPRESSIONS     1. Left ventricular ejection fraction, by estimation, is 55 to 60%. The  left ventricle has normal function. The left ventricle has no regional  wall motion abnormalities. Left ventricular diastolic parameters are  indeterminate.   2. Right ventricular systolic function is normal. The right ventricular  size is normal. There is normal pulmonary artery systolic pressure.   3. The  mitral valve is normal in structure. No evidence of mitral valve  regurgitation. No evidence of mitral stenosis.   4. The aortic valve has an indeterminant number of cusps. Aortic valve  regurgitation is moderate. Mild to moderate aortic valve  sclerosis/calcification is present, without any evidence of aortic  stenosis.   5. Aortic dilatation noted. There is mild dilatation of the ascending  aorta measuring 40 mm.   6. The inferior vena cava is normal in size with greater than 50%  respiratory variability, suggesting right atrial pressure of 3 mmHg.    Assessment and Plan        Arnoldo Lenis, M.D., F.A.C.C.

## 2021-06-18 NOTE — Progress Notes (Signed)
Cardiology Office Note  Date: 06/19/2021   ID: Tina Brooks, DOB 09-10-1941, MRN [id number]  PCP:  [place] Nation, MD  Cardiologist:  None Electrophysiologist:  None   Chief Complaint: 79-month follow-up   History of Present Illness: Tina Brooks is a 80 y.o. female with a history of palpitations, cardiac murmur, PVCs, chest pain, hypertension  Last encounter with Dr. Bronson Ing 04/23/2020.  She had recent been evaluated in emergency room for chest pain and palpitations on 04/07/2020.  Previously placed on metoprolol for palpitations by prior cardiologist.  Blood pressure was elevated at 171/72.  Troponin, CBC, renal function, magnesium were normal.  Chest x-ray showed no evidence of acute cardiopulmonary disease.  She had felt her most recent palpitations were related to anxiety and caffeine.  She stopped drinking Coca-Cola and avoided caffeinated beverages.  Symptoms had since subsided.  She denied any chest pain or dyspnea, lower extremity edema, PND, orthopnea.  She was continuing metoprolol 50 mg daily.  2D echocardiogram was ordered.  She was last here for 66-month follow-up.  Denied any acute illnesses or hospitalizations in the interim.  She established with a new doctor at [facility], Dr. Jimmye Norman.  She had some minor issues with upper respiratory symptoms and he prescribed an inhaler which improved her symptoms.  Stated some of these symptoms seem to be related to having a cat which she may be allergic.  She denied any significant palpitations since increasing her metoprolol.  She was having some issues with adjusting her thyroid medications which seemed to be getting better.  No anginal or exertional symptoms, orthostatic symptoms, CVA or TIA-like symptoms, PND, orthopnea, claudication, DVT or PE-like symptoms, lower extremity edema.  She is here for 70-month follow-up today.  She denies any acute illnesses or hospitalizations in the interim since last visit.   Denies any palpitations or arrhythmias, DOE or shortness of breath, lightheadedness, dizziness, presyncopal or syncopal episodes.  Denies any anginal.  EKG today shows sinus rhythm with marked sinus arrhythmia with a rate of 72, septal infarct, age undetermined, ST and T wave abnormality, consider lateral ischemia.  She denies any CVA or TIA-like symptoms, PND, orthopnea, bleeding, claudication, DVT or PE-like symptoms, or lower extremity edema.  She is maintaining her weight in the 132-133 range.    Past Medical History:  Diagnosis Date   DVT (deep venous thrombosis) (HCC)    RT leg   Giant cell arteritis (HCC)    High cholesterol    Thyroid disease     Past Surgical History:  Procedure Laterality Date   ABDOMINAL HYSTERECTOMY     BREAST BIOPSY Left    CESAREAN SECTION  1964, 1972   giant cell biopsy     KIDNEY SURGERY Left    left kidney removed   MOUTH SURGERY     SKIN CANCER DESTRUCTION     on back and chest   TOE SURGERY Right    nail removed    Current Outpatient Medications  Medication Sig Dispense Refill   albuterol (VENTOLIN HFA) 108 (90 Base) MCG/ACT inhaler Inhale 2 puffs into the lungs in the morning, at noon, in the evening, and at bedtime.     amitriptyline (ELAVIL) 25 MG tablet Takes 1/4 tab by mouth at bedtime     levothyroxine (SYNTHROID) 88 MCG tablet Take 88 mcg by mouth daily.     metoprolol tartrate (LOPRESSOR) 50 MG tablet Take 50 mg by mouth 2 (two) times daily.  4   zolpidem (  AMBIEN CR) 12.5 MG CR tablet Take 6.25 mg by mouth at bedtime.      No current facility-administered medications for this visit.   Allergies:  Amoxicillin and Elemental sulfur   Social History: The patient  reports that she has quit smoking. Her smoking use included cigarettes. She has never used smokeless tobacco. She reports that she does not drink alcohol and does not use drugs.   Family History: The patient's family history includes Aneurysm in her mother; Diabetes in her  brother; Healthy in her daughter; Heart attack in her paternal grandfather; Other in her son; Stroke in her father; Tuberculosis in her maternal grandfather.   ROS:  Please see the history of present illness. Otherwise, complete review of systems is positive for none.  All other systems are reviewed and negative.   Physical Exam: VS:  BP 100/60   Pulse 72   Ht 5\' 4"  (1.626 m)   Wt 133 lb (60.3 kg)   SpO2 97%   BMI 22.83 kg/m , BMI Body mass index is 22.83 kg/m.  Wt Readings from Last 3 Encounters:  06/19/21 133 lb (60.3 kg)  11/21/20 132 lb (59.9 kg)  04/23/20 136 lb 12.8 oz (62.1 kg)    General: Patient appears comfortable at rest. Neck: Supple, no elevated JVP or carotid bruits, no thyromegaly. Lungs: Clear to auscultation, nonlabored breathing at rest. Cardiac: Regular rate and rhythm, no S3, mild systolic murmur best heard at right upper sternal border., no pericardial rub. Extremities: No pitting edema, distal pulses 2+. Skin: Warm and dry. Musculoskeletal: No kyphosis. Neuropsychiatric: Alert and oriented x3, affect grossly appropriate.  ECG: 06/19/2021.  Sinus rhythm with marked sinus arrhythmia rate of 72.  Septal infarct, age undetermined, ST and T wave abnormality consider lateral ischemia.  Recent Labwork: No results found for requested labs within last 8760 hours.  No results found for: CHOL, TRIG, HDL, CHOLHDL, VLDL, LDLCALC, LDLDIRECT  Other Studies Reviewed Today:  Echocardiogram 05/29/2020  1. Left ventricular ejection fraction, by estimation, is 55 to 60%. The left ventricle has normal function. The left ventricle has no regional wall motion abnormalities. Left ventricular diastolic parameters are indeterminate. 2. Right ventricular systolic function is normal. The right ventricular size is normal. There is normal pulmonary artery systolic pressure. 3. The mitral valve is normal in structure. No evidence of mitral valve regurgitation. No evidence of mitral  stenosis. 4. The aortic valve has an indeterminant number of cusps. 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 Mild to moderate aortic valve sclerosis/calcification is present, without any evidence of aortic stenosis. 5. Aortic dilatation noted. 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 6. The inferior vena cava is normal in size with greater than 50% respiratory variability, suggesting right atrial pressure of 3 mmHg.  Assessment and Plan:   1. Chest pain, unspecified type Patient denies any recent chest pain or other anginal symptoms/exertional symptoms.  2. Palpitations Patient states having rare palpitations.  Continue metoprolol 50 mg p.o. twice daily.  3. Essential hypertension Blood pressure currently well controlled at 100/60 today.  She is asymptomatic.  No orthostatic symptoms.  Continue metoprolol 50 mg p.o. twice daily.  4.  Moderate aortic regurgitation Previous echocardiogram on 05/29/2020 demonstrated moderate aortic regurgitation.  With moderate aortic valve sclerosis/calcification.  Mild dilatation of ascending aorta at 40 mm.  Please get a follow-up echocardiogram a week before 29-month follow-up to reassess.  Medication Adjustments/Labs and Tests Ordered: Current medicines are reviewed at length with the patient today.  Concerns regarding medicines are outlined above.   Disposition: Follow-up with Dr. Domenic Polite or APP 6 months.  Signed, Levell July, NP 06/19/2021 9:20 AM    [facility] [address], [place], [place] [address] Phone: [phone]; Fax: [phone]

## 2021-06-19 ENCOUNTER — Ambulatory Visit (INDEPENDENT_AMBULATORY_CARE_PROVIDER_SITE_OTHER): Payer: Medicare Other | Admitting: Family Medicine

## 2021-06-19 ENCOUNTER — Encounter: Payer: Self-pay | Admitting: Family Medicine

## 2021-06-19 VITALS — BP 100/60 | HR 72 | Ht 64.0 in | Wt 133.0 lb

## 2021-06-19 DIAGNOSIS — R002 Palpitations: Secondary | ICD-10-CM

## 2021-06-19 DIAGNOSIS — I10 Essential (primary) hypertension: Secondary | ICD-10-CM

## 2021-06-19 DIAGNOSIS — I351 Nonrheumatic aortic (valve) insufficiency: Secondary | ICD-10-CM | POA: Diagnosis not present

## 2021-06-19 NOTE — Patient Instructions (Signed)

## 2021-07-06 DIAGNOSIS — E039 Hypothyroidism, unspecified: Secondary | ICD-10-CM | POA: Diagnosis not present

## 2021-07-06 DIAGNOSIS — Z6822 Body mass index (BMI) 22.0-22.9, adult: Secondary | ICD-10-CM | POA: Diagnosis not present

## 2021-07-06 DIAGNOSIS — M810 Age-related osteoporosis without current pathological fracture: Secondary | ICD-10-CM | POA: Diagnosis not present

## 2021-07-06 DIAGNOSIS — G4700 Insomnia, unspecified: Secondary | ICD-10-CM | POA: Diagnosis not present

## 2021-07-06 DIAGNOSIS — I499 Cardiac arrhythmia, unspecified: Secondary | ICD-10-CM | POA: Diagnosis not present

## 2021-07-06 DIAGNOSIS — E7801 Familial hypercholesterolemia: Secondary | ICD-10-CM | POA: Diagnosis not present

## 2021-07-06 DIAGNOSIS — R7303 Prediabetes: Secondary | ICD-10-CM | POA: Diagnosis not present

## 2021-07-06 DIAGNOSIS — R0600 Dyspnea, unspecified: Secondary | ICD-10-CM | POA: Diagnosis not present

## 2021-07-27 DIAGNOSIS — L820 Inflamed seborrheic keratosis: Secondary | ICD-10-CM | POA: Diagnosis not present

## 2021-07-27 DIAGNOSIS — L814 Other melanin hyperpigmentation: Secondary | ICD-10-CM | POA: Diagnosis not present

## 2021-07-27 DIAGNOSIS — Z85828 Personal history of other malignant neoplasm of skin: Secondary | ICD-10-CM | POA: Diagnosis not present

## 2021-07-27 DIAGNOSIS — L298 Other pruritus: Secondary | ICD-10-CM | POA: Diagnosis not present

## 2021-07-27 DIAGNOSIS — D485 Neoplasm of uncertain behavior of skin: Secondary | ICD-10-CM | POA: Diagnosis not present

## 2021-07-27 DIAGNOSIS — L538 Other specified erythematous conditions: Secondary | ICD-10-CM | POA: Diagnosis not present

## 2021-07-27 DIAGNOSIS — D229 Melanocytic nevi, unspecified: Secondary | ICD-10-CM | POA: Diagnosis not present

## 2021-07-27 DIAGNOSIS — Z08 Encounter for follow-up examination after completed treatment for malignant neoplasm: Secondary | ICD-10-CM | POA: Diagnosis not present

## 2021-07-27 DIAGNOSIS — L821 Other seborrheic keratosis: Secondary | ICD-10-CM | POA: Diagnosis not present

## 2021-07-27 DIAGNOSIS — C44719 Basal cell carcinoma of skin of left lower limb, including hip: Secondary | ICD-10-CM | POA: Diagnosis not present

## 2021-09-14 DIAGNOSIS — C44719 Basal cell carcinoma of skin of left lower limb, including hip: Secondary | ICD-10-CM | POA: Diagnosis not present

## 2021-11-09 DIAGNOSIS — M810 Age-related osteoporosis without current pathological fracture: Secondary | ICD-10-CM | POA: Diagnosis not present

## 2021-11-09 DIAGNOSIS — I499 Cardiac arrhythmia, unspecified: Secondary | ICD-10-CM | POA: Diagnosis not present

## 2021-11-09 DIAGNOSIS — R7303 Prediabetes: Secondary | ICD-10-CM | POA: Diagnosis not present

## 2021-11-09 DIAGNOSIS — R0600 Dyspnea, unspecified: Secondary | ICD-10-CM | POA: Diagnosis not present

## 2021-11-09 DIAGNOSIS — G4700 Insomnia, unspecified: Secondary | ICD-10-CM | POA: Diagnosis not present

## 2021-11-09 DIAGNOSIS — E039 Hypothyroidism, unspecified: Secondary | ICD-10-CM | POA: Diagnosis not present

## 2021-11-09 DIAGNOSIS — E7800 Pure hypercholesterolemia, unspecified: Secondary | ICD-10-CM | POA: Diagnosis not present

## 2021-11-09 DIAGNOSIS — Z1389 Encounter for screening for other disorder: Secondary | ICD-10-CM | POA: Diagnosis not present

## 2021-11-09 DIAGNOSIS — E7801 Familial hypercholesterolemia: Secondary | ICD-10-CM | POA: Diagnosis not present

## 2021-11-09 DIAGNOSIS — Z6822 Body mass index (BMI) 22.0-22.9, adult: Secondary | ICD-10-CM | POA: Diagnosis not present

## 2021-11-09 DIAGNOSIS — Z1331 Encounter for screening for depression: Secondary | ICD-10-CM | POA: Diagnosis not present

## 2021-12-08 DIAGNOSIS — D649 Anemia, unspecified: Secondary | ICD-10-CM | POA: Diagnosis not present

## 2021-12-08 DIAGNOSIS — M810 Age-related osteoporosis without current pathological fracture: Secondary | ICD-10-CM | POA: Diagnosis not present

## 2021-12-28 DIAGNOSIS — D519 Vitamin B12 deficiency anemia, unspecified: Secondary | ICD-10-CM | POA: Diagnosis not present

## 2021-12-28 DIAGNOSIS — D649 Anemia, unspecified: Secondary | ICD-10-CM | POA: Diagnosis not present

## 2021-12-28 DIAGNOSIS — D529 Folate deficiency anemia, unspecified: Secondary | ICD-10-CM | POA: Diagnosis not present

## 2021-12-29 ENCOUNTER — Telehealth: Payer: Self-pay | Admitting: Cardiology

## 2021-12-29 NOTE — Telephone Encounter (Signed)
Contacted patient today by telephone trying to schedule a follow up appointment. No answer

## 2022-01-29 DIAGNOSIS — Z6822 Body mass index (BMI) 22.0-22.9, adult: Secondary | ICD-10-CM | POA: Diagnosis not present

## 2022-01-29 DIAGNOSIS — N390 Urinary tract infection, site not specified: Secondary | ICD-10-CM | POA: Diagnosis not present

## 2022-02-24 ENCOUNTER — Emergency Department (HOSPITAL_COMMUNITY): Payer: Medicare Other

## 2022-02-24 ENCOUNTER — Other Ambulatory Visit: Payer: Self-pay

## 2022-02-24 ENCOUNTER — Encounter (HOSPITAL_COMMUNITY): Payer: Self-pay

## 2022-02-24 ENCOUNTER — Emergency Department (HOSPITAL_COMMUNITY)
Admission: EM | Admit: 2022-02-24 | Discharge: 2022-02-24 | Disposition: A | Discharge status: Home / Self Care | Payer: Medicare Other | Attending: Emergency Medicine | Admitting: Emergency Medicine

## 2022-02-24 DIAGNOSIS — W5501XA Bitten by cat, initial encounter: Secondary | ICD-10-CM | POA: Insufficient documentation

## 2022-02-24 DIAGNOSIS — Z2914 Encounter for prophylactic rabies immune globin: Secondary | ICD-10-CM | POA: Diagnosis not present

## 2022-02-24 DIAGNOSIS — S61451A Open bite of right hand, initial encounter: Secondary | ICD-10-CM | POA: Diagnosis not present

## 2022-02-24 DIAGNOSIS — Z23 Encounter for immunization: Secondary | ICD-10-CM | POA: Insufficient documentation

## 2022-02-24 DIAGNOSIS — Z203 Contact with and (suspected) exposure to rabies: Secondary | ICD-10-CM | POA: Insufficient documentation

## 2022-02-24 DIAGNOSIS — S6991XA Unspecified injury of right wrist, hand and finger(s), initial encounter: Secondary | ICD-10-CM | POA: Diagnosis present

## 2022-02-24 DIAGNOSIS — S61431A Puncture wound without foreign body of right hand, initial encounter: Secondary | ICD-10-CM | POA: Diagnosis not present

## 2022-02-24 MED ORDER — RABIES VACCINE, PCEC IM SUSR
1.0000 mL | Freq: Once | INTRAMUSCULAR | Status: AC
  Administered 2022-02-24: 1 mL via INTRAMUSCULAR
  Filled 2022-02-24: qty 1

## 2022-02-24 MED ORDER — RABIES IMMUNE GLOBULIN 150 UNIT/ML IM INJ
20.0000 [IU]/kg | INJECTION | Freq: Once | INTRAMUSCULAR | Status: AC
  Administered 2022-02-24: 1200 [IU] via INTRAMUSCULAR
  Filled 2022-02-24: qty 8

## 2022-02-24 MED ORDER — TETANUS-DIPHTH-ACELL PERTUSSIS 5-2.5-18.5 LF-MCG/0.5 IM SUSY
0.5000 mL | PREFILLED_SYRINGE | Freq: Once | INTRAMUSCULAR | Status: AC
  Administered 2022-02-24: 0.5 mL via INTRAMUSCULAR
  Filled 2022-02-24: qty 0.5

## 2022-02-24 MED ORDER — MOXIFLOXACIN HCL 400 MG PO TABS: 400.0000 mg | ORAL_TABLET | Freq: Every day | ORAL | 0 refills | Status: DC

## 2022-02-24 NOTE — ED Provider Notes (Cosign Needed Addendum)
[facility] Provider Note   CSN: [id number] Arrival date & time: 02/24/22  1523     History  Chief Complaint  Patient presents with   Animal Bite    cat    Tina Brooks is a 81 y.o. female, right handed, presenting with right hand pain and swelling after being bitten by a cat.  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  Unfortunately she does not know the cats vaccine status and has animal control has tried to catch the cat in the past using a trap but they gave up as they were unsuccessful.  Pt reports mild pain and swelling at the site of the bites and bruising surrounding the bites. She can fully make a fist without pain, denies fevers or chills. Her last tetanus was given about 10 years ago.   The history is provided by the patient.      Home Medications Prior to Admission medications   Medication Sig Start Date End Date Taking? Authorizing Provider  moxifloxacin (AVELOX) 400 MG tablet Take 1 tablet (400 mg total) by mouth daily. 02/24/22  Yes Orley Lawry, Almyra Free, PA-C  albuterol (VENTOLIN HFA) 108 (90 Base) MCG/ACT inhaler Inhale 2 puffs into the lungs in the morning, at noon, in the evening, and at bedtime.    [provider]  amitriptyline (ELAVIL) 25 MG tablet Takes 1/4 tab by mouth at bedtime 03/26/20   [provider]  levothyroxine (SYNTHROID) 88 MCG tablet Take 88 mcg by mouth daily. 03/26/20   [provider]  metoprolol tartrate (LOPRESSOR) 50 MG tablet Take 50 mg by mouth 2 (two) times daily. 01/24/18   [provider]  zolpidem (AMBIEN CR) 12.5 MG CR tablet Take 6.25 mg by mouth at bedtime.  03/26/20   [provider]      Allergies    Amoxicillin and Elemental sulfur    Review of Systems   Review of Systems  Constitutional:  Negative for chills and fever.  Musculoskeletal:  Positive for arthralgias. Negative for joint swelling and myalgias.   Skin:  Positive for color change and wound.  Neurological:  Negative for weakness and numbness.  All other systems reviewed and are negative.  Physical Exam Updated Vital Signs BP (!) 156/57 (BP Location: Right Arm)    Pulse 64    Temp 97.7 F (36.5 C) (Oral)    Resp 15    Ht '5\' 4"'$  (1.626 m)    Wt 61.2 kg    SpO2 97%    BMI 23.17 kg/m  Physical Exam Constitutional:      Appearance: She is well-developed.  HENT:     Head: Atraumatic.  Cardiovascular:     Comments: Pulses equal bilaterally Musculoskeletal:        General: Tenderness present.     Cervical back: Normal range of motion.     Comments: Mild tenderness right dorsal hand. Two puncture wounds dorsal hand with surrounding erythema, bruising and mild increased warms. Several small abrasions from claw scratches present also.  No red streaking.  Pt can flex/ext wrist and fingers fully without pain.   Skin:    General: Skin is warm and dry.  Neurological:     Mental Status: She is alert.     Sensory: No sensory deficit.     Motor: No weakness.     Deep Tendon Reflexes: Reflexes normal.    ED  Results / Procedures / Treatments   Labs (all labs ordered are listed, but only abnormal results are displayed) Labs Reviewed - No data to display  EKG None  Radiology DG Hand Complete Right  Result Date: 02/24/2022 CLINICAL DATA:  Cat bite, cat bite. EXAM: RIGHT HAND - COMPLETE 3+ VIEW COMPARISON:  None. FINDINGS: The bones are osteopenic. There is no evidence for acute fracture or dislocation. There are mild degenerative changes the first carpometacarpal joint. Soft tissues are within normal limits. There is no evidence for foreign body. IMPRESSION: 1. No evidence for foreign body. 2. No acute bony abnormality. Electronically Signed   By: Ronney Asters M.D.   On: 02/24/2022 16:25    Procedures Procedures    Medications Ordered in ED Medications  rabies immune globulin (HYPERAB/KEDRAB) injection 1,200 Units (has no  administration in time range)  Tdap (BOOSTRIX) injection 0.5 mL (0.5 mLs Intramuscular Given 02/24/22 1647)  rabies vaccine (RABAVERT) injection 1 mL (1 mL Intramuscular Given 02/24/22 1650)    ED Course/ Medical Decision Making/ A&P                           Medical Decision Making Patient presenting with a cat bite to her right hand which occurred yesterday, this is a cat known to her as she has been feeding him for the past year but he is a stray and no one has been able to catch him including animal control.  For that reason she is being started on the rabies protocol.  She was also given a tetanus vaccine.  Moxifloxacin started as she is amoxicillin allergic.  There is no evidence for joint space or tendon involvement at this time, her wound at this time appears to be very superficial cellulitis type infection without evidence for abscess.  She was given strict precautions regarding follow-up care if she develops increasing swelling, pain or spreading redness, referral to Dr. Amedeo Kinsman if any of the symptoms worsen.  Rabies vaccine schedule was given.  Animal control was notified.  Amount and/or Complexity of Data Reviewed Radiology: ordered.    Details: X-rays negative for fracture, foreign body or subcutaneous gas.  Risk Prescription drug management.           Final Clinical Impression(s) / ED Diagnoses Final diagnoses:  Cat bite, initial encounter  Need for post exposure prophylaxis for rabies    Rx / DC Orders ED Discharge Orders          Ordered    moxifloxacin (AVELOX) 400 MG tablet  Daily        02/24/22 1617              Evalee Jefferson, PA-C 02/24/22 Hobson, Nadya Hopwood, PA-C 02/24/22 1708

## 2022-02-24 NOTE — ED Triage Notes (Signed)
Reports cat bite last night to R hand.  Hand red, swollen, and warm to touch.  Unknown vaccination status to cat.  Resp even and unlabored.  Skin warm and dry.  nad ?

## 2022-02-24 NOTE — ED Notes (Signed)
[facility] called to report animal bite to animal control.  ?

## 2022-02-24 NOTE — Discharge Instructions (Signed)
You have been treated for a cat bite today with antibiotics and you have been protected from tetanus and rabies with the vaccines given.  Your xray is negative for fractures or foreign body.  I recommend using tylenol or motrin for pain relief. Take the entire course of the antibiotics prescribed.  Keep site clean with soap and water.  Avoid any further exposure of peroxide to your skin (can cause irritation if used chronically, but is good to initially clean out the wound).   ? ?See the schedule and instructions for obtaining the remainder of your rabies vaccines.   ?

## 2022-02-24 NOTE — ED Notes (Signed)
Stage manager speaking with pt at this time. ?

## 2022-02-27 ENCOUNTER — Ambulatory Visit
Admission: EM | Admit: 2022-02-27 | Discharge: 2022-02-27 | Disposition: A | Discharge status: Home / Self Care | Payer: Medicare Other | Attending: Family Medicine | Admitting: Family Medicine

## 2022-02-27 ENCOUNTER — Ambulatory Visit: Payer: Self-pay

## 2022-02-27 ENCOUNTER — Other Ambulatory Visit: Payer: Self-pay

## 2022-02-27 DIAGNOSIS — Z2914 Encounter for prophylactic rabies immune globin: Secondary | ICD-10-CM | POA: Diagnosis not present

## 2022-02-27 DIAGNOSIS — Z203 Contact with and (suspected) exposure to rabies: Secondary | ICD-10-CM | POA: Diagnosis not present

## 2022-02-27 MED ORDER — RABIES VACCINE, PCEC IM SUSR
1.0000 mL | Freq: Once | INTRAMUSCULAR | Status: AC
  Administered 2022-02-27: 1 mL via INTRAMUSCULAR

## 2022-02-27 NOTE — ED Triage Notes (Signed)
Pt here for 2nd dose of rabies vaccine.  ?

## 2022-03-03 ENCOUNTER — Other Ambulatory Visit: Payer: Self-pay

## 2022-03-03 ENCOUNTER — Ambulatory Visit
Admission: RE | Admit: 2022-03-03 | Discharge: 2022-03-03 | Disposition: A | Discharge status: Home / Self Care | Payer: Medicare Other | Source: Ambulatory Visit | Attending: Family Medicine | Admitting: Family Medicine

## 2022-03-03 DIAGNOSIS — Z203 Contact with and (suspected) exposure to rabies: Secondary | ICD-10-CM

## 2022-03-03 DIAGNOSIS — Z2914 Encounter for prophylactic rabies immune globin: Secondary | ICD-10-CM

## 2022-03-03 MED ORDER — RABIES VACCINE, PCEC IM SUSR
1.0000 mL | Freq: Once | INTRAMUSCULAR | Status: AC
  Administered 2022-03-03: 1 mL via INTRAMUSCULAR

## 2022-03-03 NOTE — ED Triage Notes (Signed)
Here for 3rd rabies vaccine.

## 2022-03-10 ENCOUNTER — Other Ambulatory Visit: Payer: Self-pay

## 2022-03-10 ENCOUNTER — Ambulatory Visit
Admission: RE | Admit: 2022-03-10 | Discharge: 2022-03-10 | Disposition: A | Discharge status: Home / Self Care | Payer: Medicare Other | Source: Ambulatory Visit | Attending: Internal Medicine | Admitting: Internal Medicine

## 2022-03-10 DIAGNOSIS — Z2914 Encounter for prophylactic rabies immune globin: Secondary | ICD-10-CM | POA: Diagnosis not present

## 2022-03-10 DIAGNOSIS — Z203 Contact with and (suspected) exposure to rabies: Secondary | ICD-10-CM | POA: Diagnosis not present

## 2022-03-10 NOTE — ED Triage Notes (Signed)
Pt presents for Rabies vaccine # 4.  ?

## 2022-03-29 DIAGNOSIS — H26493 Other secondary cataract, bilateral: Secondary | ICD-10-CM | POA: Diagnosis not present

## 2022-03-29 DIAGNOSIS — H04123 Dry eye syndrome of bilateral lacrimal glands: Secondary | ICD-10-CM | POA: Diagnosis not present

## 2022-03-29 DIAGNOSIS — M316 Other giant cell arteritis: Secondary | ICD-10-CM | POA: Diagnosis not present

## 2022-03-29 DIAGNOSIS — Z961 Presence of intraocular lens: Secondary | ICD-10-CM | POA: Diagnosis not present

## 2022-03-29 DIAGNOSIS — H35033 Hypertensive retinopathy, bilateral: Secondary | ICD-10-CM | POA: Diagnosis not present

## 2022-04-09 IMAGING — DX DG HAND COMPLETE 3+V*R*
3 series · 3 of 3 positions shown · non-contrast
Comparison: None.

CLINICAL DATA: Cat bite, cat bite.

EXAM:
RIGHT HAND - COMPLETE 3+ VIEW

[hand ap]
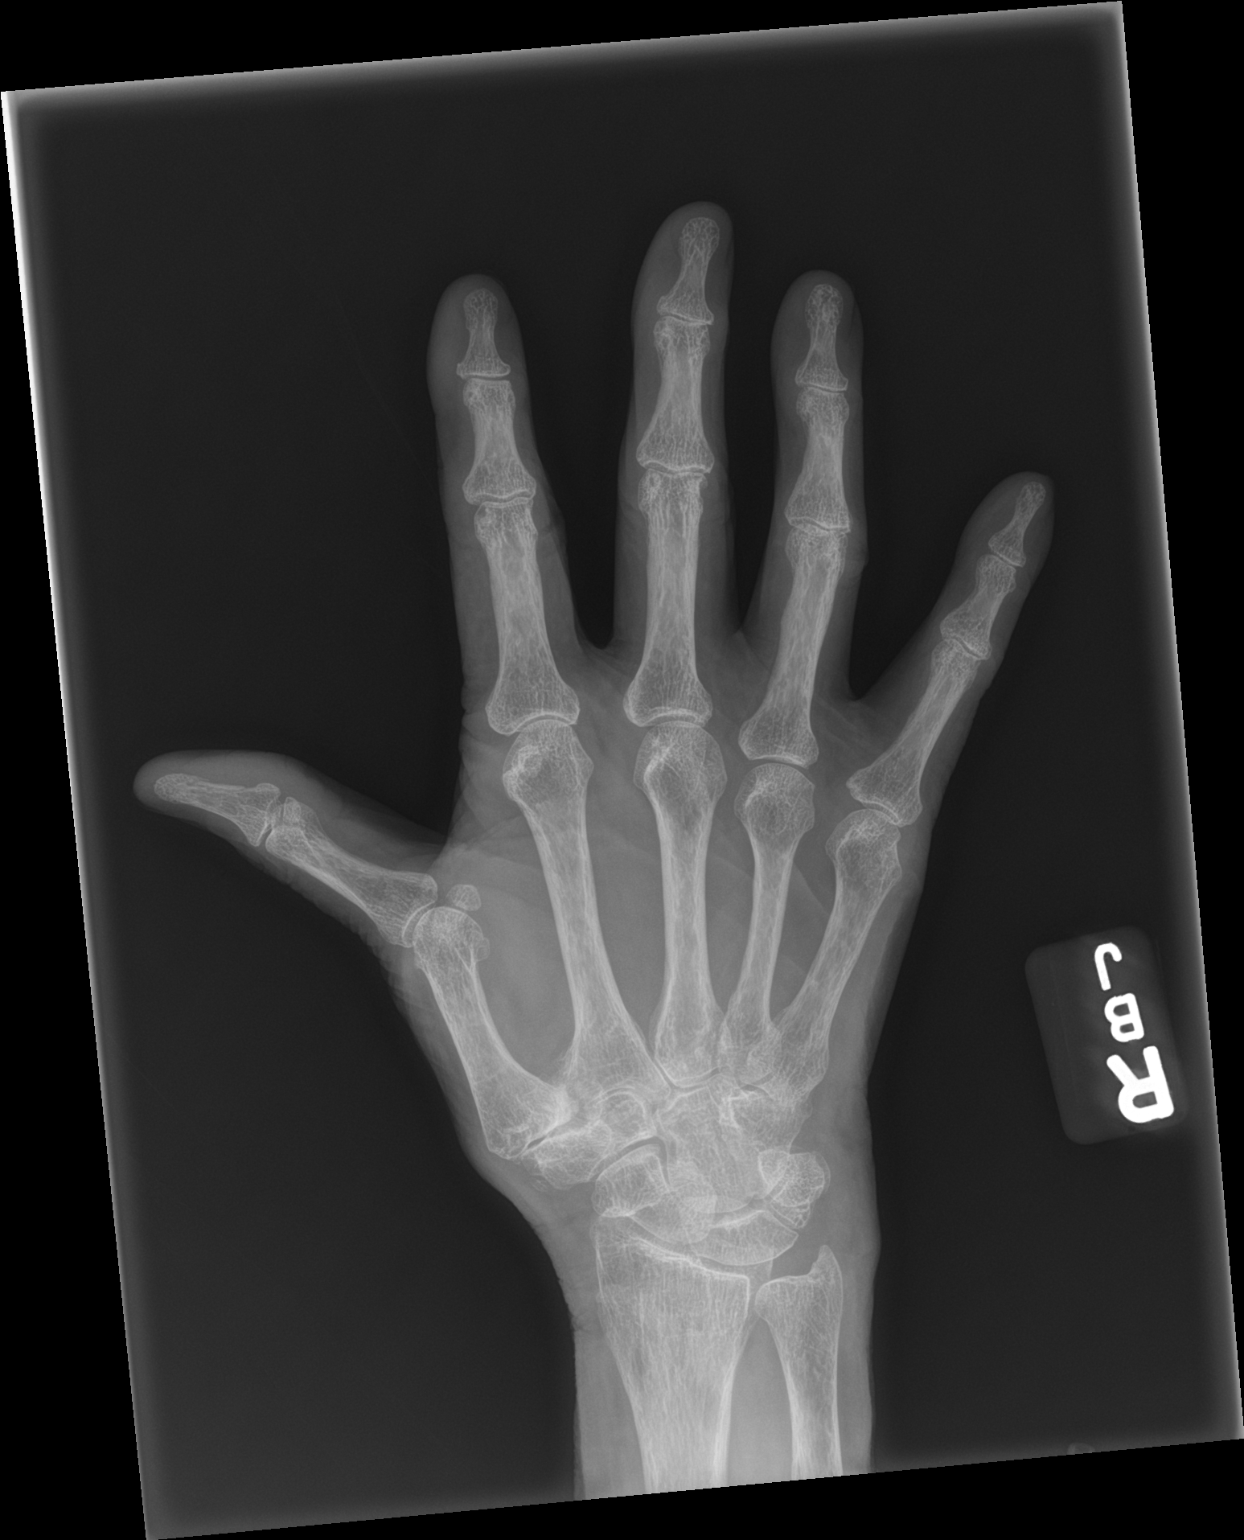

[hand obl]
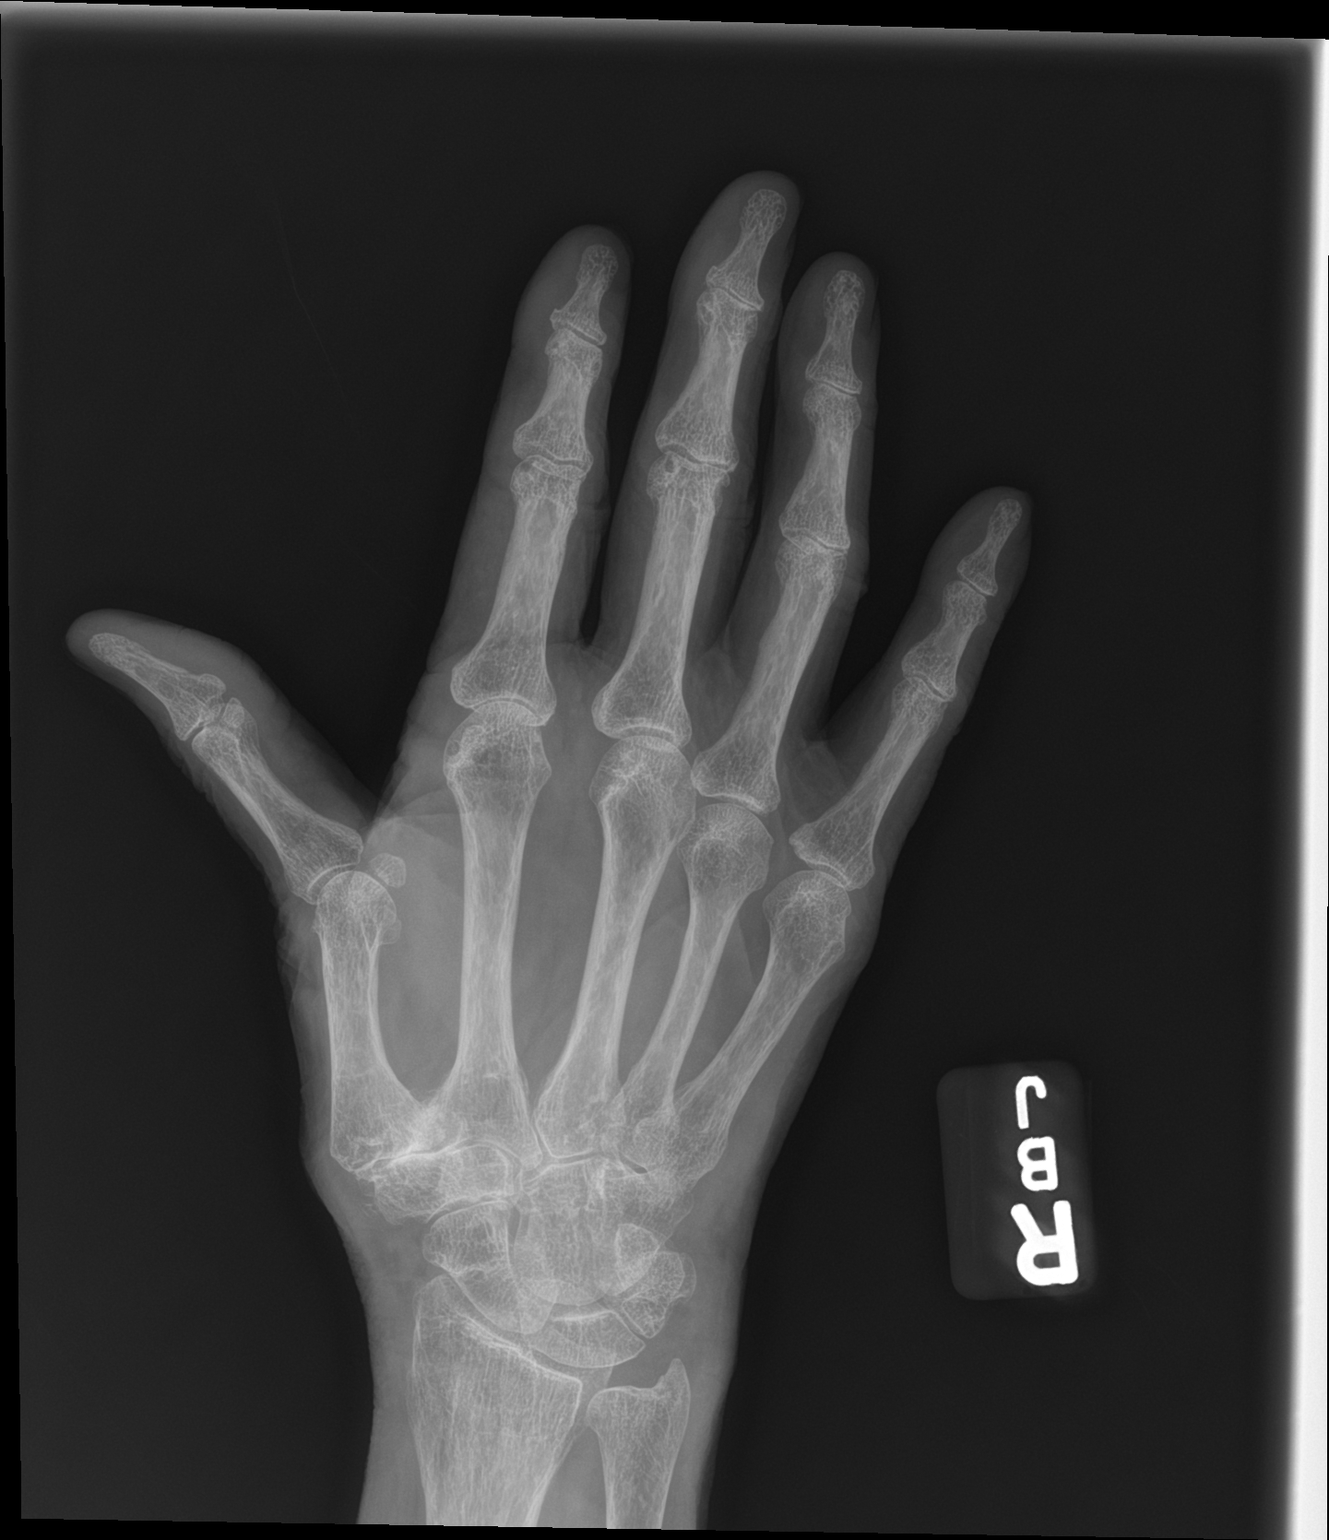

[hand lat]
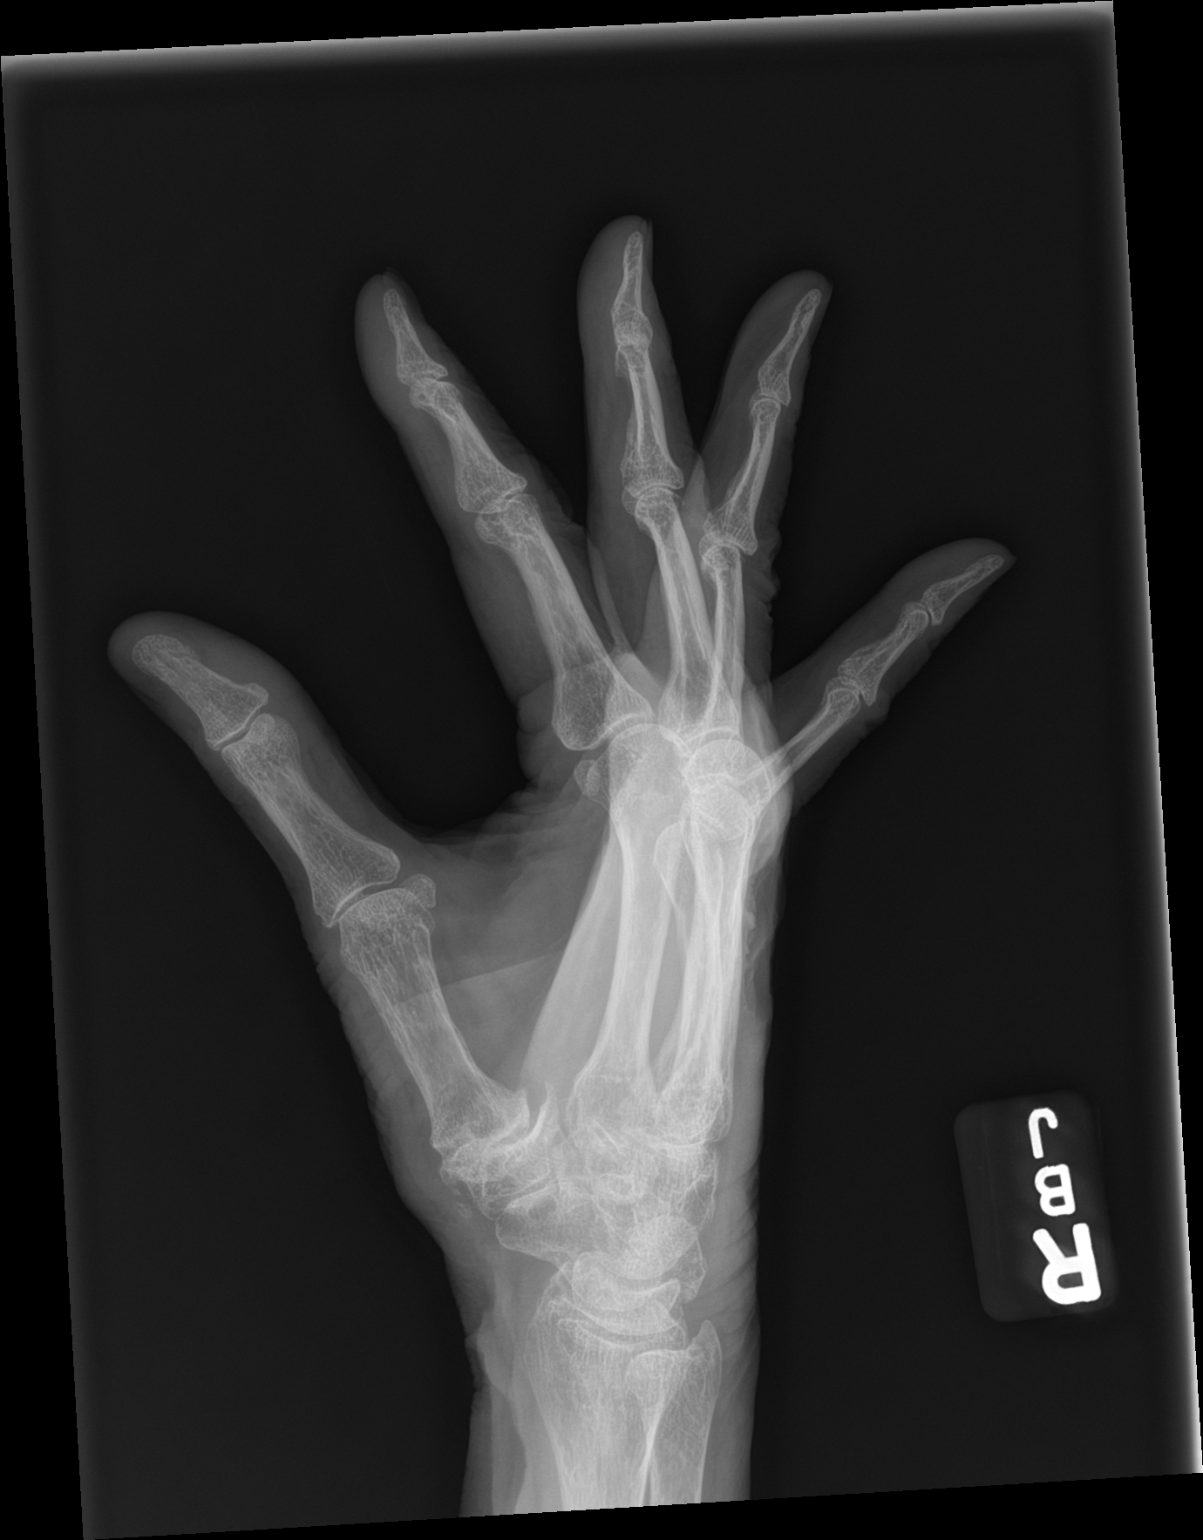

[3 of 3 positions shown; findings below may reference images not displayed]

FINDINGS: The bones are osteopenic. There is no evidence for acute fracture or
dislocation. There are mild degenerative changes the first
carpometacarpal joint. Soft tissues are within normal limits. There
is no evidence for foreign body.
IMPRESSION: 1. No evidence for foreign body.
2. No acute bony abnormality.

## 2022-04-30 DIAGNOSIS — E039 Hypothyroidism, unspecified: Secondary | ICD-10-CM | POA: Diagnosis not present

## 2022-04-30 DIAGNOSIS — M810 Age-related osteoporosis without current pathological fracture: Secondary | ICD-10-CM | POA: Diagnosis not present

## 2022-04-30 DIAGNOSIS — E7800 Pure hypercholesterolemia, unspecified: Secondary | ICD-10-CM | POA: Diagnosis not present

## 2022-04-30 DIAGNOSIS — E7801 Familial hypercholesterolemia: Secondary | ICD-10-CM | POA: Diagnosis not present

## 2022-04-30 DIAGNOSIS — R7303 Prediabetes: Secondary | ICD-10-CM | POA: Diagnosis not present

## 2022-05-04 DIAGNOSIS — Z6822 Body mass index (BMI) 22.0-22.9, adult: Secondary | ICD-10-CM | POA: Diagnosis not present

## 2022-05-04 DIAGNOSIS — I499 Cardiac arrhythmia, unspecified: Secondary | ICD-10-CM | POA: Diagnosis not present

## 2022-05-04 DIAGNOSIS — R0600 Dyspnea, unspecified: Secondary | ICD-10-CM | POA: Diagnosis not present

## 2022-05-04 DIAGNOSIS — M810 Age-related osteoporosis without current pathological fracture: Secondary | ICD-10-CM | POA: Diagnosis not present

## 2022-05-04 DIAGNOSIS — E7801 Familial hypercholesterolemia: Secondary | ICD-10-CM | POA: Diagnosis not present

## 2022-05-04 DIAGNOSIS — E039 Hypothyroidism, unspecified: Secondary | ICD-10-CM | POA: Diagnosis not present

## 2022-05-04 DIAGNOSIS — I10 Essential (primary) hypertension: Secondary | ICD-10-CM | POA: Diagnosis not present

## 2022-05-04 DIAGNOSIS — R7303 Prediabetes: Secondary | ICD-10-CM | POA: Diagnosis not present

## 2022-06-02 DIAGNOSIS — D225 Melanocytic nevi of trunk: Secondary | ICD-10-CM | POA: Diagnosis not present

## 2022-06-02 DIAGNOSIS — L821 Other seborrheic keratosis: Secondary | ICD-10-CM | POA: Diagnosis not present

## 2022-06-02 DIAGNOSIS — L814 Other melanin hyperpigmentation: Secondary | ICD-10-CM | POA: Diagnosis not present

## 2022-06-02 DIAGNOSIS — L570 Actinic keratosis: Secondary | ICD-10-CM | POA: Diagnosis not present

## 2022-06-02 DIAGNOSIS — L720 Epidermal cyst: Secondary | ICD-10-CM | POA: Diagnosis not present

## 2022-07-20 ENCOUNTER — Encounter: Payer: Self-pay | Admitting: Cardiology

## 2022-07-20 ENCOUNTER — Ambulatory Visit (INDEPENDENT_AMBULATORY_CARE_PROVIDER_SITE_OTHER): Payer: Medicare Other | Admitting: Cardiology

## 2022-07-20 VITALS — BP 140/75 | HR 62 | Ht 64.0 in | Wt 134.6 lb

## 2022-07-20 DIAGNOSIS — R030 Elevated blood-pressure reading, without diagnosis of hypertension: Secondary | ICD-10-CM | POA: Diagnosis not present

## 2022-07-20 DIAGNOSIS — I351 Nonrheumatic aortic (valve) insufficiency: Secondary | ICD-10-CM

## 2022-07-20 DIAGNOSIS — I493 Ventricular premature depolarization: Secondary | ICD-10-CM | POA: Diagnosis not present

## 2022-07-20 DIAGNOSIS — R002 Palpitations: Secondary | ICD-10-CM | POA: Diagnosis not present

## 2022-07-20 NOTE — Progress Notes (Signed)
Clinical Summary Ms. Auble is a 81 y.o.female former patient of Dr Bronson Ing, this is our first visit together. Seen for the following medical problems.  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 On and off throughout the day - One coffee in AM, rare sodas, no tea, no energy drinks, no EtOH    2. Elevated blood pressure - manual recheck today 140/75 - from prios visits typically well controlled   3. Aortic regurgitation -05/2020 echo: LVEF 55-60%, no WMAs, mod AI, aortic sclerosis without stenosis. Aortic root 40 mm -no recent SOb,DOE Past Medical History:  Diagnosis Date   DVT (deep venous thrombosis) (HCC)    RT leg   Giant cell arteritis (HCC)    High cholesterol    Thyroid disease      Allergies  Allergen Reactions   Amoxicillin Other (See Comments)    diarrhea   Elemental Sulfur Hives     Current Outpatient Medications  Medication Sig Dispense Refill   albuterol (VENTOLIN HFA) 108 (90 Base) MCG/ACT inhaler Inhale 2 puffs into the lungs in the morning, at noon, in the evening, and at bedtime.     amitriptyline (ELAVIL) 25 MG tablet Takes 1/4 tab by mouth at bedtime     levothyroxine (SYNTHROID) 88 MCG tablet Take 88 mcg by mouth daily.     metoprolol tartrate (LOPRESSOR) 50 MG tablet Take 50 mg by mouth 2 (two) times daily.  4   moxifloxacin (AVELOX) 400 MG tablet Take 1 tablet (400 mg total) by mouth daily. 7 tablet 0   zolpidem (AMBIEN CR) 12.5 MG CR tablet Take 6.25 mg by mouth at bedtime.      No current facility-administered medications for this visit.     Past Surgical History:  Procedure Laterality Date   ABDOMINAL HYSTERECTOMY     BREAST BIOPSY Left    CESAREAN SECTION  1964, 1972   giant cell biopsy     KIDNEY SURGERY Left    left kidney removed   MOUTH SURGERY     SKIN CANCER DESTRUCTION     on back and chest   TOE SURGERY Right    nail removed     Allergies  Allergen Reactions    Amoxicillin Other (See Comments)    diarrhea   Elemental Sulfur Hives      Family History  Problem Relation Age of Onset   Heart attack Paternal Grandfather    Tuberculosis Maternal Grandfather    Stroke Father        Stem cell   Aneurysm Mother        brain   Diabetes Brother    Healthy Daughter    Other Son        IV septal defect; pulmonary stenosis     Social History Ms. Rochette reports that she has quit smoking. Her smoking use included cigarettes. She has never used smokeless tobacco. Ms. Valeri reports no history of alcohol use.   Review of Systems CONSTITUTIONAL: No weight loss, fever, chills, weakness or fatigue.  HEENT: Eyes: No visual loss, blurred vision, double vision or yellow sclerae.No hearing loss, sneezing, congestion, runny nose or sore throat.  SKIN: No rash or itching.  CARDIOVASCULAR: per hpi RESPIRATORY: No shortness of breath, cough or sputum.  GASTROINTESTINAL: No anorexia, nausea, vomiting or diarrhea. No abdominal pain or blood.  GENITOURINARY: No burning on urination, no polyuria NEUROLOGICAL: No headache, dizziness, syncope, paralysis, ataxia, numbness or tingling  in the extremities. No change in bowel or bladder control.  MUSCULOSKELETAL: No muscle, back pain, joint pain or stiffness.  LYMPHATICS: No enlarged nodes. No history of splenectomy.  PSYCHIATRIC: No history of depression or anxiety.  ENDOCRINOLOGIC: No reports of sweating, cold or heat intolerance. No polyuria or polydipsia.  Marland Kitchen   Physical Examination Today's Vitals   07/20/22 1004  BP: (!) 150/70  Pulse: 62  SpO2: 100%  Weight: 134 lb 9.6 oz (61.1 kg)  Height: '5\' 4"'$  (1.626 m)   Body mass index is 23.1 kg/m.  Gen: resting comfortably, no acute distress HEENT: no scleral icterus, pupils equal round and reactive, no palptable cervical adenopathy,  CV: RRR, 2/6 systolic murmur rusb, no jvd Resp: Clear to auscultation bilaterally GI: abdomen is soft, non-tender,  non-distended, normal bowel sounds, no hepatosplenomegaly MSK: extremities are warm, no edema.  Skin: warm, no rash Neuro:  no focal deficits Psych: appropriate affect   Diagnostic Studies  05/2020 echo 1. Left ventricular ejection fraction, by estimation, is 55 to 60%. The  left ventricle has normal function. The left ventricle has no regional  wall motion abnormalities. Left ventricular diastolic parameters are  indeterminate.   2. Right ventricular systolic function is normal. The right ventricular  size is normal. There is normal pulmonary artery systolic pressure.   3. The mitral valve is normal in structure. No evidence of mitral valve  regurgitation. No evidence of mitral stenosis.   4. The aortic valve has an indeterminant number of cusps. Aortic valve  regurgitation is moderate. Mild to moderate aortic valve  sclerosis/calcification is present, without any evidence of aortic  stenosis.   5. Aortic dilatation noted. There is mild dilatation of the ascending  aorta measuring 40 mm.   6. The inferior vena cava is normal in size with greater than 50%  respiratory variability, suggesting right atrial pressure of 3 mmHg.      Assessment and Plan   PVCs/palpitations - some ongoing symptoms, will increase lopressor to '75mg'$  bid - if ongoing symptoms would plan for holter to better quantify PVC burden and any other potential arrhythmias - EKG today shows NSR  2. Aortic regurgitation - will repeat echo  3. Elevated blood pressure - typically normal bp's, first elevation by our visits - increase lopressor for palpitations, monitor bp     Arnoldo Lenis, M.D.

## 2022-07-20 NOTE — Addendum Note (Signed)
Addended by: Sung Amabile on: 07/20/2022 10:37 AM   Modules accepted: Orders

## 2022-07-20 NOTE — Patient Instructions (Signed)
Medication Instructions:  Your physician has recommended you make the following change in your medication:  Increase metoprolol to 75 mg twice a day Continue all other medications as directed  Labwork: none  Testing/Procedures: Your physician has requested that you have an echocardiogram. Echocardiography is a painless test that uses sound waves to create images of your heart. It provides your doctor with information about the size and shape of your heart and how well your heart's chambers and valves are working. This procedure takes approximately one hour. There are no restrictions for this procedure.   Follow-Up:  Your physician recommends that you schedule a follow-up appointment in: 6 months  Any Other Special Instructions Will Be Listed Below (If Applicable).  If you need a refill on your cardiac medications before your next appointment, please call your pharmacy.

## 2022-07-21 ENCOUNTER — Ambulatory Visit (INDEPENDENT_AMBULATORY_CARE_PROVIDER_SITE_OTHER): Payer: Medicare Other

## 2022-07-21 DIAGNOSIS — I351 Nonrheumatic aortic (valve) insufficiency: Secondary | ICD-10-CM

## 2022-07-22 LAB — ECHOCARDIOGRAM COMPLETE
AV Mean grad: 4.7 mmHg
AV Peak grad: 11.7 mmHg
AV Vena cont: 0.35 cm
Ao pk vel: 1.71 m/s
Area-P 1/2: 2.28 cm2
Calc EF: 56.7 %
MV M vel: 3.72 m/s
MV Peak grad: 55.4 mmHg
P 1/2 time: 552 msec
S' Lateral: 3.38 cm
Single Plane A2C EF: 53.9 %
Single Plane A4C EF: 59.5 %

## 2022-07-26 NOTE — Progress Notes (Signed)
lmtcb

## 2022-12-02 DIAGNOSIS — L821 Other seborrheic keratosis: Secondary | ICD-10-CM | POA: Diagnosis not present

## 2022-12-02 DIAGNOSIS — L814 Other melanin hyperpigmentation: Secondary | ICD-10-CM | POA: Diagnosis not present

## 2022-12-02 DIAGNOSIS — L570 Actinic keratosis: Secondary | ICD-10-CM | POA: Diagnosis not present

## 2022-12-02 DIAGNOSIS — D229 Melanocytic nevi, unspecified: Secondary | ICD-10-CM | POA: Diagnosis not present

## 2022-12-02 DIAGNOSIS — D485 Neoplasm of uncertain behavior of skin: Secondary | ICD-10-CM | POA: Diagnosis not present

## 2023-01-20 ENCOUNTER — Ambulatory Visit: Payer: Medicare Other | Admitting: Cardiology

## 2023-01-27 DIAGNOSIS — G4700 Insomnia, unspecified: Secondary | ICD-10-CM | POA: Diagnosis not present

## 2023-01-27 DIAGNOSIS — Z6823 Body mass index (BMI) 23.0-23.9, adult: Secondary | ICD-10-CM | POA: Diagnosis not present

## 2023-01-27 DIAGNOSIS — I499 Cardiac arrhythmia, unspecified: Secondary | ICD-10-CM | POA: Diagnosis not present

## 2023-01-27 DIAGNOSIS — E039 Hypothyroidism, unspecified: Secondary | ICD-10-CM | POA: Diagnosis not present

## 2023-01-27 DIAGNOSIS — I351 Nonrheumatic aortic (valve) insufficiency: Secondary | ICD-10-CM | POA: Diagnosis not present

## 2023-01-27 DIAGNOSIS — R7303 Prediabetes: Secondary | ICD-10-CM | POA: Diagnosis not present

## 2023-01-27 DIAGNOSIS — M810 Age-related osteoporosis without current pathological fracture: Secondary | ICD-10-CM | POA: Diagnosis not present

## 2023-01-27 DIAGNOSIS — E7801 Familial hypercholesterolemia: Secondary | ICD-10-CM | POA: Diagnosis not present

## 2023-01-27 DIAGNOSIS — R0600 Dyspnea, unspecified: Secondary | ICD-10-CM | POA: Diagnosis not present

## 2023-01-27 DIAGNOSIS — R030 Elevated blood-pressure reading, without diagnosis of hypertension: Secondary | ICD-10-CM | POA: Diagnosis not present

## 2023-02-21 DIAGNOSIS — R030 Elevated blood-pressure reading, without diagnosis of hypertension: Secondary | ICD-10-CM | POA: Diagnosis not present

## 2023-02-21 DIAGNOSIS — Z6823 Body mass index (BMI) 23.0-23.9, adult: Secondary | ICD-10-CM | POA: Diagnosis not present

## 2023-02-21 DIAGNOSIS — R109 Unspecified abdominal pain: Secondary | ICD-10-CM | POA: Diagnosis not present

## 2023-02-23 DIAGNOSIS — R109 Unspecified abdominal pain: Secondary | ICD-10-CM | POA: Diagnosis not present

## 2023-02-28 DIAGNOSIS — Z6823 Body mass index (BMI) 23.0-23.9, adult: Secondary | ICD-10-CM | POA: Diagnosis not present

## 2023-02-28 DIAGNOSIS — R030 Elevated blood-pressure reading, without diagnosis of hypertension: Secondary | ICD-10-CM | POA: Diagnosis not present

## 2023-02-28 DIAGNOSIS — R109 Unspecified abdominal pain: Secondary | ICD-10-CM | POA: Diagnosis not present

## 2023-03-31 DIAGNOSIS — M316 Other giant cell arteritis: Secondary | ICD-10-CM | POA: Diagnosis not present

## 2023-03-31 DIAGNOSIS — H04123 Dry eye syndrome of bilateral lacrimal glands: Secondary | ICD-10-CM | POA: Diagnosis not present

## 2023-03-31 DIAGNOSIS — H26493 Other secondary cataract, bilateral: Secondary | ICD-10-CM | POA: Diagnosis not present

## 2023-03-31 DIAGNOSIS — H35033 Hypertensive retinopathy, bilateral: Secondary | ICD-10-CM | POA: Diagnosis not present

## 2023-05-24 DIAGNOSIS — Z13220 Encounter for screening for lipoid disorders: Secondary | ICD-10-CM | POA: Diagnosis not present

## 2023-05-24 DIAGNOSIS — E559 Vitamin D deficiency, unspecified: Secondary | ICD-10-CM | POA: Diagnosis not present

## 2023-05-24 DIAGNOSIS — E7801 Familial hypercholesterolemia: Secondary | ICD-10-CM | POA: Diagnosis not present

## 2023-05-24 DIAGNOSIS — R7303 Prediabetes: Secondary | ICD-10-CM | POA: Diagnosis not present

## 2023-05-24 DIAGNOSIS — E039 Hypothyroidism, unspecified: Secondary | ICD-10-CM | POA: Diagnosis not present

## 2023-05-31 DIAGNOSIS — M810 Age-related osteoporosis without current pathological fracture: Secondary | ICD-10-CM | POA: Diagnosis not present

## 2023-05-31 DIAGNOSIS — R7303 Prediabetes: Secondary | ICD-10-CM | POA: Diagnosis not present

## 2023-05-31 DIAGNOSIS — E039 Hypothyroidism, unspecified: Secondary | ICD-10-CM | POA: Diagnosis not present

## 2023-05-31 DIAGNOSIS — Z6822 Body mass index (BMI) 22.0-22.9, adult: Secondary | ICD-10-CM | POA: Diagnosis not present

## 2023-05-31 DIAGNOSIS — I499 Cardiac arrhythmia, unspecified: Secondary | ICD-10-CM | POA: Diagnosis not present

## 2023-05-31 DIAGNOSIS — R0600 Dyspnea, unspecified: Secondary | ICD-10-CM | POA: Diagnosis not present

## 2023-05-31 DIAGNOSIS — I351 Nonrheumatic aortic (valve) insufficiency: Secondary | ICD-10-CM | POA: Diagnosis not present

## 2023-05-31 DIAGNOSIS — G4700 Insomnia, unspecified: Secondary | ICD-10-CM | POA: Diagnosis not present

## 2023-05-31 DIAGNOSIS — E7801 Familial hypercholesterolemia: Secondary | ICD-10-CM | POA: Diagnosis not present

## 2023-05-31 DIAGNOSIS — R030 Elevated blood-pressure reading, without diagnosis of hypertension: Secondary | ICD-10-CM | POA: Diagnosis not present

## 2023-06-03 ENCOUNTER — Ambulatory Visit: Payer: Medicare Other | Attending: Cardiology | Admitting: Nurse Practitioner

## 2023-06-03 VITALS — BP 162/60 | HR 59 | Ht 64.0 in | Wt 133.6 lb

## 2023-06-03 DIAGNOSIS — R002 Palpitations: Secondary | ICD-10-CM | POA: Insufficient documentation

## 2023-06-03 DIAGNOSIS — I351 Nonrheumatic aortic (valve) insufficiency: Secondary | ICD-10-CM | POA: Diagnosis not present

## 2023-06-03 DIAGNOSIS — E7800 Pure hypercholesterolemia, unspecified: Secondary | ICD-10-CM | POA: Diagnosis not present

## 2023-06-03 DIAGNOSIS — I10 Essential (primary) hypertension: Secondary | ICD-10-CM | POA: Diagnosis not present

## 2023-06-03 DIAGNOSIS — I493 Ventricular premature depolarization: Secondary | ICD-10-CM | POA: Diagnosis not present

## 2023-06-03 MED ORDER — BLOOD PRESSURE MONITORING MISC: 1.0000 | 0 refills | Status: AC

## 2023-06-03 NOTE — Patient Instructions (Addendum)
Medication Instructions:  Your physician recommends that you continue on your current medications as directed. Please refer to the Current Medication list given to you today.  Labwork: none  Testing/Procedures: none  Follow-Up: Your physician recommends that you schedule a follow-up appointment in: 6 month  Any Other Special Instructions Will Be Listed Below (If Applicable). Your physician has requested that you regularly monitor and record your blood pressure readings at home. Please use the same machine at the same time of day to check your readings and record them to bring to your follow-up visit. If your top blood pressure number is consistently above 140, please let us know Blood Pressure Record Sheet To take your blood pressure, you will need a blood pressure machine. You may be prescribed one, or you can buy a blood pressure machine (blood pressure monitor) at your clinic, drug store, or online. When choosing one, look for these features: An automatic monitor that has an arm cuff. A cuff that wraps snugly, but not too tightly, around your upper arm. You should be able to fit only one finger between your arm and the cuff. A device that stores blood pressure reading results. Do not choose a monitor that measures your blood pressure from your wrist or finger. Follow your health care provider's instructions for how to take your blood pressure. To use this form: Get one reading in the morning (a.m.) before you take any medicines. Get one reading in the evening (p.m.) before supper. Take at least two readings with each blood pressure check. This makes sure the results are correct. Wait 1-2 minutes between measurements. Write down the results in the spaces on this form. Repeat this once a week, or as told by your health care provider. Make a follow-up appointment with your health care provider to discuss the results. Blood pressure log Date: _______________________ a.m.  _____________________(1st reading) _____________________(2nd reading) p.m. _____________________(1st reading) _____________________(2nd reading) Date: _______________________ a.m. _____________________(1st reading) _____________________(2nd reading) p.m. _____________________(1st reading) _____________________(2nd reading) Date: _______________________ a.m. _____________________(1st reading) _____________________(2nd reading) p.m. _____________________(1st reading) _____________________(2nd reading) Date: _______________________ a.m. _____________________(1st reading) _____________________(2nd reading) p.m. _____________________(1st reading) _____________________(2nd reading) Date: _______________________ a.m. _____________________(1st reading) _____________________(2nd reading) p.m. _____________________(1st reading) _____________________(2nd reading) This information is not intended to replace advice given to you by your health care provider. Make sure you discuss any questions you have with your health care provider. Document Revised: 08/20/2021 Document Reviewed: 08/20/2021 Elsevier Patient Education  2024 [employer].   If you need a refill on your cardiac medications before your next appointment, please call your pharmacy.

## 2023-06-03 NOTE — Progress Notes (Signed)
Office Visit    Patient Name: Tina Brooks Date of Encounter: 06/03/2023  PCP:  Donetta Potts, MD   [facility]  Cardiologist:  Dina Rich, MD  Advanced Practice Provider:  No care team member to display Electrophysiologist:  None   Chief Complaint    Tina Brooks is a 82 y.o. female with a hx of palpitations, history of PVCs, elevated blood pressure, hypercholesterolemia, aortic regurgitation, past history of DVT, thyroid disease, and giant cell arteritis, who presents today for 26-month follow-up.  Past Medical History    Past Medical History:  Diagnosis Date   DVT (deep venous thrombosis) (HCC)    RT leg   Giant cell arteritis (HCC)    High cholesterol    Thyroid disease    Past Surgical History:  Procedure Laterality Date   ABDOMINAL HYSTERECTOMY     BREAST BIOPSY Left    CESAREAN SECTION  1964, 1972   giant cell biopsy     KIDNEY SURGERY Left    left kidney removed   MOUTH SURGERY     SKIN CANCER DESTRUCTION     on back and chest   TOE SURGERY Right    nail removed    Allergies  Allergies  Allergen Reactions   Amoxicillin Other (See Comments)    diarrhea   Elemental Sulfur Hives    History of Present Illness    Tina Brooks is a 82 y.o. female with a PMH as mentioned above.  Last seen by Dr. Dina Rich on July 20, 2022.  She noted some palpitations intermittently during the day.  Blood pressure was elevated during exam.  Lopressor increased to 75 mg twice daily.  Recommended if ongoing symptoms, would plan for Holter monitor.  EKG in office revealed sinus rhythm.  Echocardiogram was arranged and revealed EF 55 to 60%, grade 1 DD, mild calcification of aortic valve, moderate AR.   Today she presents for overdue 9-month follow-up appointment. Says she is doing well. Says she would like to eventually come off Metoprolol. Denies any chest pain, shortness of breath, palpitations, syncope, presyncope,  dizziness, orthopnea, PND, swelling or significant weight changes, acute bleeding, or claudication.  EKGs/Labs/Other Studies Reviewed:   The following studies were reviewed today:   EKG:  EKG is not ordered today.    Echo 07/2022: 1. Left ventricular ejection fraction, by estimation, is 55 to 60%. The  left ventricle has normal function. The left ventricle has no regional  wall motion abnormalities. Left ventricular diastolic parameters are  consistent with Grade I diastolic  dysfunction (impaired relaxation). The average left ventricular global  longitudinal strain is -17.4 %. The global longitudinal strain is normal.   2. Right ventricular systolic function is normal. The right ventricular  size is normal.   3. Left atrial size was mildly dilated.   4. The mitral valve is normal in structure. No evidence of mitral valve  regurgitation. No evidence of mitral stenosis.   5. The tricuspid valve is abnormal.   6. The aortic valve was not well visualized. There is mild calcification  of the aortic valve. There is mild thickening of the aortic valve. Aortic  valve regurgitation is moderate. No aortic stenosis is present.   7. The inferior vena cava is normal in size with greater than 50%  respiratory variability, suggesting right atrial pressure of 3 mmHg.   Comparison(s): Echocardiogram done 05/30/20 showed an EF of 55-60% with  moderate AI and mild to moderate aortic sclerosis  without stenosis.  Recent Labs: No results found for requested labs within last 365 days.  Recent Lipid Panel No results found for: "CHOL", "TRIG", "HDL", "CHOLHDL", "VLDL", "LDLCALC", "LDLDIRECT"  Home Medications   Current Meds  Medication Sig   albuterol (VENTOLIN HFA) 108 (90 Base) MCG/ACT inhaler Inhale 2 puffs into the lungs in the morning, at noon, in the evening, and at bedtime.   levothyroxine (SYNTHROID) 88 MCG tablet Take 88 mcg by mouth daily.   metoprolol tartrate (LOPRESSOR) 50 MG tablet Take  50 mg by mouth 2 (two) times daily.   zolpidem (AMBIEN CR) 12.5 MG CR tablet Take 10 mg by mouth at bedtime.     Review of Systems    All other systems reviewed and are otherwise negative except as noted above.  Physical Exam    VS:  BP (!) 162/60   Pulse (!) 59   Ht 5\' 4"  (1.626 m)   Wt 133 lb 9.6 oz (60.6 kg)   SpO2 99%   BMI 22.93 kg/m  , BMI Body mass index is 22.93 kg/m.  Wt Readings from Last 3 Encounters:  06/03/23 133 lb 9.6 oz (60.6 kg)  07/20/22 134 lb 9.6 oz (61.1 kg)  02/24/22 134 lb 14.7 oz (61.2 kg)   Repeat BP: 155/62  GEN: Well nourished, well developed, in no acute distress. HEENT: normal. Neck: Supple, no JVD, carotid bruits, or masses. Cardiac: S1/S2, RRR, no murmurs, rubs, or gallops. No clubbing, cyanosis, edema.  Radials/PT 2+ and equal bilaterally.  Respiratory:  Respirations regular and unlabored, clear to auscultation bilaterally. MS: No deformity or atrophy. Skin: Warm and dry, no rash. Neuro:  Strength and sensation are intact. Psych: Normal affect.  Assessment & Plan    Palpitations, PVC's  Denies any tachycardia or palpitations.  Continue medication regimen.  Will request most recent labs with PCP. Heart healthy diet and regular cardiovascular exercise encouraged.   HTN Blood pressure elevated today.  Does admit to some recent blood pressure elevations.  Does admit to wanting to get off Metoprolol, but overall tolerating well and no acute distress on exam. Given BP log and salty six diet sheet. Discussed to monitor BP at home at least 2 hours after medications and sitting for 5-10 minutes.  She will let me know in the next 2 to 3 weeks if her blood pressure remains elevated, and if so, plan to initiate lisinopril 5 mg daily.  Will write Rx for blood pressure monitor.  Hypercholesterolemia Will request most recent labs from PCP.  She is not on any lipid-lowering medications at that time. Heart healthy diet and regular cardiovascular exercise  encouraged.   4.  Aortic valve regurgitation Noted as moderate on echo 07/2022, no stenosis noted.  Asymptomatic.  Recommend updating echocardiogram in the next 1 to 2 years per protocol.   Disposition: Follow up in 6 month(s) with Dina Rich, MD or APP.  Signed, Sharlene Dory, NP 06/03/2023, 4:16 PM [facility]

## 2023-09-22 ENCOUNTER — Telehealth: Payer: Self-pay | Admitting: Cardiology

## 2023-09-22 NOTE — Telephone Encounter (Signed)
Left message to call back  

## 2023-09-22 NOTE — Telephone Encounter (Signed)
Pt c/o BP issue: STAT if pt c/o blurred vision, one-sided weakness or slurred speech  1. What are your last 5 BP readings?   147/50  2. Are you having any other symptoms (ex. Dizziness, headache, blurred vision, passed out)?   Dizziness, headache, blurred vision (sometimes)  3. What is your BP issue?   Patient is concerned she had high BP readings and a headache - mostly in the upper and back part of her head.  Patient wants advice on next steps.

## 2023-09-26 NOTE — Telephone Encounter (Signed)
Left message to return call 

## 2023-09-29 NOTE — Telephone Encounter (Signed)
Left message to return call 

## 2023-10-03 NOTE — Telephone Encounter (Signed)
Unable to reach patient. Left patient message advising to call back if she is still having symptoms.

## 2023-10-04 NOTE — Telephone Encounter (Addendum)
Spoke with pt who states that she did not receive a call back. Informed pt of dates and times of nurse calls. Pt states that she is " Good now". Pt thankful for the call back. She reports that she has since made an appt and will see [place] in December.

## 2023-10-04 NOTE — Telephone Encounter (Signed)
Pt calling back

## 2023-11-23 DIAGNOSIS — E7801 Familial hypercholesterolemia: Secondary | ICD-10-CM | POA: Diagnosis not present

## 2023-11-23 DIAGNOSIS — D649 Anemia, unspecified: Secondary | ICD-10-CM | POA: Diagnosis not present

## 2023-11-23 DIAGNOSIS — E039 Hypothyroidism, unspecified: Secondary | ICD-10-CM | POA: Diagnosis not present

## 2023-11-23 DIAGNOSIS — R7303 Prediabetes: Secondary | ICD-10-CM | POA: Diagnosis not present

## 2023-11-30 DIAGNOSIS — I499 Cardiac arrhythmia, unspecified: Secondary | ICD-10-CM | POA: Diagnosis not present

## 2023-11-30 DIAGNOSIS — R7303 Prediabetes: Secondary | ICD-10-CM | POA: Diagnosis not present

## 2023-11-30 DIAGNOSIS — Z1389 Encounter for screening for other disorder: Secondary | ICD-10-CM | POA: Diagnosis not present

## 2023-11-30 DIAGNOSIS — I351 Nonrheumatic aortic (valve) insufficiency: Secondary | ICD-10-CM | POA: Diagnosis not present

## 2023-11-30 DIAGNOSIS — G4700 Insomnia, unspecified: Secondary | ICD-10-CM | POA: Diagnosis not present

## 2023-11-30 DIAGNOSIS — R0600 Dyspnea, unspecified: Secondary | ICD-10-CM | POA: Diagnosis not present

## 2023-11-30 DIAGNOSIS — R030 Elevated blood-pressure reading, without diagnosis of hypertension: Secondary | ICD-10-CM | POA: Diagnosis not present

## 2023-11-30 DIAGNOSIS — E7801 Familial hypercholesterolemia: Secondary | ICD-10-CM | POA: Diagnosis not present

## 2023-11-30 DIAGNOSIS — Z6822 Body mass index (BMI) 22.0-22.9, adult: Secondary | ICD-10-CM | POA: Diagnosis not present

## 2023-11-30 DIAGNOSIS — Z1331 Encounter for screening for depression: Secondary | ICD-10-CM | POA: Diagnosis not present

## 2023-11-30 DIAGNOSIS — M810 Age-related osteoporosis without current pathological fracture: Secondary | ICD-10-CM | POA: Diagnosis not present

## 2023-11-30 DIAGNOSIS — E039 Hypothyroidism, unspecified: Secondary | ICD-10-CM | POA: Diagnosis not present

## 2023-12-01 ENCOUNTER — Ambulatory Visit: Payer: Medicare Other | Attending: Cardiology | Admitting: Cardiology

## 2023-12-01 VITALS — BP 148/68 | HR 68 | Ht 65.0 in | Wt 131.4 lb

## 2023-12-01 DIAGNOSIS — I351 Nonrheumatic aortic (valve) insufficiency: Secondary | ICD-10-CM | POA: Insufficient documentation

## 2023-12-01 DIAGNOSIS — I493 Ventricular premature depolarization: Secondary | ICD-10-CM | POA: Insufficient documentation

## 2023-12-01 DIAGNOSIS — I10 Essential (primary) hypertension: Secondary | ICD-10-CM | POA: Diagnosis not present

## 2023-12-01 DIAGNOSIS — R002 Palpitations: Secondary | ICD-10-CM | POA: Insufficient documentation

## 2023-12-01 MED ORDER — DILTIAZEM HCL 30 MG PO TABS: 30.0000 mg | ORAL_TABLET | Freq: Two times a day (BID) | ORAL | 6 refills | Status: DC

## 2023-12-01 NOTE — Progress Notes (Signed)
Clinical Summary Tina Brooks is a 82 y.o.female seen today for follow up of the following medical problems.   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 On and off throughout the day - One coffee in AM, rare sodas, no tea, no energy drinks, no EtOH    - some palpitations at times, typically at night. Just for a few seconds. - metoprolol can cause upset stomach and some SOB per her report, has not wanted to go to higher dose, interested in trying alternative.    2. Elevated blood pressure - manual recheck today 140/75 - from prios visits typically well controlled   - home bp's 140s/60s   3. Aortic regurgitation -05/2020 echo: LVEF 55-60%, no WMAs, mod AI, aortic sclerosis without stenosis. Aortic root 40 mm -no recent SOb,DOE  07/2022 echo: LVEF 55-60%, no WMAs, grade I dd, mod AI Past Medical History:  Diagnosis Date   DVT (deep venous thrombosis) (HCC)    RT leg   Giant cell arteritis (HCC)    High cholesterol    Thyroid disease      Allergies  Allergen Reactions   Amoxicillin Other (See Comments)    diarrhea   Elemental Sulfur Hives     Current Outpatient Medications  Medication Sig Dispense Refill   albuterol (VENTOLIN HFA) 108 (90 Base) MCG/ACT inhaler Inhale 2 puffs into the lungs in the morning, at noon, in the evening, and at bedtime.     Blood Pressure Monitoring MISC 1 each by Does not apply route as directed. Adult BP cuff dx: hyertension 1 each 0   diltiazem (CARDIZEM) 30 MG tablet Take 1 tablet (30 mg total) by mouth 2 (two) times daily. 60 tablet 6   levothyroxine (SYNTHROID) 88 MCG tablet Take 88 mcg by mouth daily.     zolpidem (AMBIEN CR) 12.5 MG CR tablet Take 10 mg by mouth at bedtime.     amitriptyline (ELAVIL) 25 MG tablet Takes 1/2 tab by mouth at bedtime (Patient not taking: Reported on 12/01/2023)     No current facility-administered medications for this visit.     Past Surgical  History:  Procedure Laterality Date   ABDOMINAL HYSTERECTOMY     BREAST BIOPSY Left    CESAREAN SECTION  1964, 1972   giant cell biopsy     KIDNEY SURGERY Left    left kidney removed   MOUTH SURGERY     SKIN CANCER DESTRUCTION     on back and chest   TOE SURGERY Right    nail removed     Allergies  Allergen Reactions   Amoxicillin Other (See Comments)    diarrhea   Elemental Sulfur Hives      Family History  Problem Relation Age of Onset   Heart attack Paternal Grandfather    Tuberculosis Maternal Grandfather    Stroke Father        Stem cell   Aneurysm Mother        brain   Diabetes Brother    Healthy Daughter    Other Son        IV septal defect; pulmonary stenosis     Social History Tina Brooks reports that she has quit smoking. Her smoking use included cigarettes. She has never used smokeless tobacco. Tina Brooks reports no history of alcohol use.   Review of Systems CONSTITUTIONAL: No weight loss, fever, chills, weakness or fatigue.  HEENT: Eyes: No visual loss,  blurred vision, double vision or yellow sclerae.No hearing loss, sneezing, congestion, runny nose or sore throat.  SKIN: No rash or itching.  CARDIOVASCULAR: per hpi RESPIRATORY: No shortness of breath, cough or sputum.  GASTROINTESTINAL: No anorexia, nausea, vomiting or diarrhea. No abdominal pain or blood.  GENITOURINARY: No burning on urination, no polyuria NEUROLOGICAL: No headache, dizziness, syncope, paralysis, ataxia, numbness or tingling in the extremities. No change in bowel or bladder control.  MUSCULOSKELETAL: No muscle, back pain, joint pain or stiffness.  LYMPHATICS: No enlarged nodes. No history of splenectomy.  PSYCHIATRIC: No history of depression or anxiety.  ENDOCRINOLOGIC: No reports of sweating, cold or heat intolerance. No polyuria or polydipsia.  Marland Kitchen   Physical Examination Today's Vitals   12/01/23 1125 12/01/23 1127 12/01/23 1157  BP: (!) 142/60 (!) 150/62 (!) 148/68   Pulse: 68    SpO2: 99%    Weight: 131 lb 6.4 oz (59.6 kg)    Height: 5\' 5"  (1.651 m)     Body mass index is 21.87 kg/m.  Gen: resting comfortably, no acute distress HEENT: no scleral icterus, pupils equal round and reactive, no palptable cervical adenopathy,  CV: RRR, no mrg, no jvd Resp: Clear to auscultation bilaterally GI: abdomen is soft, non-tender, non-distended, normal bowel sounds, no hepatosplenomegaly MSK: extremities are warm, no edema.  Skin: warm, no rash Neuro:  no focal deficits Psych: appropriate affect   Diagnostic Studies  05/2020 echo 1. Left ventricular ejection fraction, by estimation, is 55 to 60%. The  left ventricle has normal function. The left ventricle has no regional  wall motion abnormalities. Left ventricular diastolic parameters are  indeterminate.   2. Right ventricular systolic function is normal. The right ventricular  size is normal. There is normal pulmonary artery systolic pressure.   3. The mitral valve is normal in structure. No evidence of mitral valve  regurgitation. No evidence of mitral stenosis.   4. The aortic valve has an indeterminant number of cusps. Aortic valve  regurgitation is moderate. Mild to moderate aortic valve  sclerosis/calcification is present, without any evidence of aortic  stenosis.   5. Aortic dilatation noted. There is mild dilatation of the ascending  aorta measuring 40 mm.   6. The inferior vena cava is normal in size with greater than 50%  respiratory variability, suggesting right atrial pressure of 3 mmHg.    07/2022 echo 1. Left ventricular ejection fraction, by estimation, is 55 to 60%. The  left ventricle has normal function. The left ventricle has no regional  wall motion abnormalities. Left ventricular diastolic parameters are  consistent with Grade I diastolic  dysfunction (impaired relaxation). The average left ventricular global  longitudinal strain is -17.4 %. The global longitudinal strain is  normal.   2. Right ventricular systolic function is normal. The right ventricular  size is normal.   3. Left atrial size was mildly dilated.   4. The mitral valve is normal in structure. No evidence of mitral valve  regurgitation. No evidence of mitral stenosis.   5. The tricuspid valve is abnormal.   6. The aortic valve was not well visualized. There is mild calcification  of the aortic valve. There is mild thickening of the aortic valve. Aortic  valve regurgitation is moderate. No aortic stenosis is present.   7. The inferior vena cava is normal in size with greater than 50%  respiratory variability, suggesting right atrial pressure of 3 mmHg.     Assessment and Plan   PVCs/palpitations - reports some  symptoms on lopressor including upset stomach and some SOB, somewhat unclear if truly related. Will try stopping lopressor, start diltiazem 30mg  bid in its place.    2. Aortic regurgitation - moderate by recent US, repeat echo in 2025   3. HTN - follow with change to dilt, nursing visit for bp check in 2 weeks.    Request pcp labs  F/u 6 months Antoine Poche, M.D.,

## 2023-12-01 NOTE — Patient Instructions (Addendum)
Medication Instructions:   Stop Metoprolol (Lopressor) Begin Diltiazem 30mg  twice a day (short acting) Continue all other medications.     Labwork:  none  Testing/Procedures:  none  Follow-Up:  6 months   Any Other Special Instructions Will Be Listed Below (If Applicable).  Nurse visit in 2 weeks for BP check   If you need a refill on your cardiac medications before your next appointment, please call your pharmacy.

## 2023-12-08 ENCOUNTER — Encounter: Payer: Self-pay | Admitting: *Deleted

## 2023-12-09 ENCOUNTER — Encounter: Payer: Self-pay | Admitting: Cardiology

## 2023-12-19 ENCOUNTER — Ambulatory Visit: Payer: Medicare Other | Attending: Cardiology

## 2023-12-19 DIAGNOSIS — I10 Essential (primary) hypertension: Secondary | ICD-10-CM | POA: Insufficient documentation

## 2023-12-19 NOTE — Progress Notes (Signed)
Patient here today for nurse visit bp check per JB  Patient states she took all morning meds this morning at 5 AM  After resting 10 minutes after arrival BP is 138/90 HR 66 SPO2 99  Let patient rest 10 more minutes and rechecked BP again  BP 138/64    HR  60  SPO2 98 Sent to JB for review

## 2023-12-19 NOTE — Progress Notes (Signed)
Bp just slightly above goal, would monitor for the time being. Are the side effects we discussed at last visit better off the [place] and on the diltiazem  Tina Ferry MD

## 2023-12-19 NOTE — Patient Instructions (Signed)
Medication Instructions:   Your physician recommends that you continue on your current medications as directed. Please refer to the Current Medication list given to you today.  Labwork:  None  Testing/Procedures:  None  Follow-Up:  Your physician recommends that you schedule a follow-up appointment in: as planned  Any Other Special Instructions Will Be Listed Below (If Applicable).  If you need a refill on your cardiac medications before your next appointment, please call your pharmacy. 

## 2024-03-14 ENCOUNTER — Encounter: Payer: Self-pay | Admitting: Emergency Medicine

## 2024-03-14 ENCOUNTER — Ambulatory Visit
Admission: EM | Admit: 2024-03-14 | Discharge: 2024-03-14 | Disposition: A | Discharge status: Home / Self Care | Attending: Family Medicine | Admitting: Family Medicine

## 2024-03-14 DIAGNOSIS — J441 Chronic obstructive pulmonary disease with (acute) exacerbation: Secondary | ICD-10-CM

## 2024-03-14 DIAGNOSIS — J3089 Other allergic rhinitis: Secondary | ICD-10-CM | POA: Diagnosis not present

## 2024-03-14 DIAGNOSIS — J22 Unspecified acute lower respiratory infection: Secondary | ICD-10-CM

## 2024-03-14 MED ORDER — AZELASTINE HCL 0.1 % NA SOLN: 1.0000 | Freq: Two times a day (BID) | NASAL | 0 refills | Status: DC

## 2024-03-14 MED ORDER — AZITHROMYCIN 250 MG PO TABS: ORAL_TABLET | ORAL | 0 refills | Status: DC

## 2024-03-14 NOTE — ED Triage Notes (Signed)
Productive cough x3 weeks;

## 2024-03-14 NOTE — Discharge Instructions (Signed)
 In addition to the prescribed medications, you may start a daily allergy medication and use your albuterol inhaler every 4 hours as needed.  Follow-up for worsening or unresolving symptoms

## 2024-03-18 NOTE — ED Provider Notes (Signed)
 [facility]    CSN: [id number] Arrival date & time: 03/14/24  1133      History   Chief Complaint Chief Complaint  Patient presents with   productive cough    HPI Tina Brooks is a 83 y.o. female.   Presenting today with 3 week history of productive cough, rhinorrhea x 3 weeks. Denies fever, chills, CP, SOB, abdominal pain, N/V/D. 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 So far trying OTC remedies with minimal relief.     Past Medical History:  Diagnosis Date   DVT (deep venous thrombosis) (HCC)    RT leg   Giant cell arteritis (HCC)    High cholesterol    Thyroid disease     There are no active problems to display for this patient.   Past Surgical History:  Procedure Laterality Date   ABDOMINAL HYSTERECTOMY     BREAST BIOPSY Left    CESAREAN SECTION  1964, 1972   giant cell biopsy     KIDNEY SURGERY Left    left kidney removed   MOUTH SURGERY     SKIN CANCER DESTRUCTION     on back and chest   TOE SURGERY Right    nail removed    OB History     Gravida  2   Para  2   Term  2   Preterm  0   AB  0   Living  2      SAB  0   IAB  0   Ectopic  0   Multiple      Live Births  2            Home Medications    Prior to Admission medications   Medication Sig Start Date End Date Taking? Authorizing Provider  azelastine (ASTELIN) 0.1 % nasal spray Place 1 spray into both nostrils 2 (two) times daily. Use in each nostril as directed 03/14/24  Yes Particia Nearing, PA-C  azithromycin (ZITHROMAX) 250 MG tablet Take first 2 tablets together, then 1 every day until finished. 03/14/24  Yes Particia Nearing, PA-C  albuterol (VENTOLIN HFA) 108 (90 Base) MCG/ACT inhaler Inhale 2 puffs into the lungs in the morning, at noon, in the evening, and at bedtime.    [provider]  amitriptyline (ELAVIL) 25 MG tablet Takes 1/2 tab by mouth at bedtime Patient not taking: Reported on 12/19/2023 03/26/20   [provider]   Blood Pressure Monitoring MISC 1 each by Does not apply route as directed. Adult BP cuff dx: hyertension 06/03/23   Sharlene Dory, NP  diltiazem (CARDIZEM) 30 MG tablet Take 1 tablet (30 mg total) by mouth 2 (two) times daily. 12/01/23   Antoine Poche, MD  levothyroxine (SYNTHROID) 88 MCG tablet Take 88 mcg by mouth daily. 03/26/20   [provider]  zolpidem (AMBIEN CR) 12.5 MG CR tablet Take 10 mg by mouth at bedtime. 03/26/20   [provider]    Family History Family History  Problem Relation Age of Onset   Heart attack Paternal Grandfather    Tuberculosis Maternal Grandfather    Stroke Father        Stem cell   Aneurysm Mother        brain   Diabetes Brother    Healthy Daughter    Other Son        IV septal defect; pulmonary stenosis    Social History Social History   Tobacco Use   Smoking  status: Former    Types: Cigarettes   Smokeless tobacco: Never  Vaping Use   Vaping status: Never Used  Substance Use Topics   Alcohol use: No   Drug use: No     Allergies   Amoxicillin and Elemental sulfur   Review of Systems Review of Systems PER HPI  Physical Exam Triage Vital Signs ED Triage Vitals  Encounter Vitals Group     BP 03/14/24 1140 (!) 162/61     Systolic BP Percentile --      Diastolic BP Percentile --      Pulse Rate 03/14/24 1140 72     Resp 03/14/24 1140 18     Temp 03/14/24 1140 97.6 F (36.4 C)     Temp Source 03/14/24 1140 Oral     SpO2 03/14/24 1140 93 %     Weight --      Height --      Head Circumference --      Peak Flow --      Pain Score 03/14/24 1141 0     Pain Loc --      Pain Education --      Exclude from Growth Chart --    No data found.  Updated Vital Signs BP (!) 162/61 (BP Location: Right Arm)   Pulse 72   Temp 97.6 F (36.4 C) (Oral)   Resp 18   SpO2 93%   Visual Acuity Right Eye Distance:   Left Eye Distance:   Bilateral Distance:    Right Eye Near:   Left Eye Near:    Bilateral  Near:     Physical Exam Vitals and nursing note reviewed.  Constitutional:      Appearance: Normal appearance.  HENT:     Head: Atraumatic.     Right Ear: Tympanic membrane and external ear normal.     Left Ear: Tympanic membrane and external ear normal.     Nose: Rhinorrhea present.     Mouth/Throat:     Mouth: Mucous membranes are moist.     Pharynx: Posterior oropharyngeal erythema present.  Eyes:     Extraocular Movements: Extraocular movements intact.     Conjunctiva/sclera: Conjunctivae normal.  Cardiovascular:     Rate and Rhythm: Normal rate and regular rhythm.     Heart sounds: Normal heart sounds.  Pulmonary:     Effort: Pulmonary effort is normal.     Breath sounds: Wheezing present. No rales.  Musculoskeletal:        General: Normal range of motion.     Cervical back: Normal range of motion and neck supple.  Skin:    General: Skin is warm and dry.  Neurological:     Mental Status: She is alert and oriented to person, place, and time.  Psychiatric:        Mood and Affect: Mood normal.        Thought Content: Thought content normal.      UC Treatments / Results  Labs (all labs ordered are listed, but only abnormal results are displayed) Labs Reviewed - No data to display  EKG   Radiology No results found.  Procedures Procedures (including critical care time)  Medications Ordered in UC Medications - No data to display  Initial Impression / Assessment and Plan / UC Course  I have reviewed the triage vital signs and the nursing notes.  Pertinent labs & imaging results that were available during my care of the patient were reviewed by me and  considered in my medical decision making (see chart for details).     Treat given duration and worsening course for lower resp infection and COPD exacerbation with zithromax, albuterol prn and allergy regimen of antihistamine and astelin. Supportive home care and return precautions reviewed.   Final Clinical  Impressions(s) / UC Diagnoses   Final diagnoses:  Seasonal allergic rhinitis due to other allergic trigger  Lower respiratory infection  COPD exacerbation (HCC)     Discharge Instructions      In addition to the prescribed medications, you may start a daily allergy medication and use your albuterol inhaler every 4 hours as needed.  Follow-up for worsening or unresolving symptoms     ED Prescriptions     Medication Sig Dispense Auth. Provider   azithromycin (ZITHROMAX) 250 MG tablet Take first 2 tablets together, then 1 every day until finished. 6 tablet Particia Nearing, [place]   azelastine (ASTELIN) 0.1 % nasal spray Place 1 spray into both nostrils 2 (two) times daily. Use in each nostril as directed 30 mL Particia Nearing, PA-C      PDMP not reviewed this encounter.   Particia Nearing, [place] 03/18/24 2204

## 2024-03-19 DIAGNOSIS — J449 Chronic obstructive pulmonary disease, unspecified: Secondary | ICD-10-CM | POA: Diagnosis not present

## 2024-03-19 DIAGNOSIS — J189 Pneumonia, unspecified organism: Secondary | ICD-10-CM | POA: Diagnosis not present

## 2024-03-19 DIAGNOSIS — R051 Acute cough: Secondary | ICD-10-CM | POA: Diagnosis not present

## 2024-04-22 ENCOUNTER — Other Ambulatory Visit: Payer: Self-pay

## 2024-04-22 ENCOUNTER — Emergency Department (HOSPITAL_COMMUNITY)

## 2024-04-22 ENCOUNTER — Encounter (HOSPITAL_COMMUNITY): Payer: Self-pay

## 2024-04-22 ENCOUNTER — Emergency Department (HOSPITAL_COMMUNITY)
Admission: EM | Admit: 2024-04-22 | Discharge: 2024-04-22 | Disposition: A | Discharge status: Home / Self Care | Attending: Emergency Medicine | Admitting: Emergency Medicine

## 2024-04-22 DIAGNOSIS — R109 Unspecified abdominal pain: Secondary | ICD-10-CM | POA: Diagnosis not present

## 2024-04-22 DIAGNOSIS — R1013 Epigastric pain: Secondary | ICD-10-CM | POA: Diagnosis not present

## 2024-04-22 DIAGNOSIS — J449 Chronic obstructive pulmonary disease, unspecified: Secondary | ICD-10-CM | POA: Diagnosis not present

## 2024-04-22 DIAGNOSIS — K5900 Constipation, unspecified: Secondary | ICD-10-CM | POA: Insufficient documentation

## 2024-04-22 DIAGNOSIS — R42 Dizziness and giddiness: Secondary | ICD-10-CM | POA: Diagnosis not present

## 2024-04-22 DIAGNOSIS — R9431 Abnormal electrocardiogram [ECG] [EKG]: Secondary | ICD-10-CM | POA: Diagnosis not present

## 2024-04-22 LAB — COMPREHENSIVE METABOLIC PANEL WITH GFR
ALT: 11 U/L (ref 0–44)
AST: 14 U/L — ABNORMAL LOW (ref 15–41)
Albumin: 3.3 g/dL — ABNORMAL LOW (ref 3.5–5.0)
Alkaline Phosphatase: 76 U/L (ref 38–126)
Anion gap: 9 (ref 5–15)
BUN: 13 mg/dL (ref 8–23)
CO2: 25 mmol/L (ref 22–32)
Calcium: 9.1 mg/dL (ref 8.9–10.3)
Chloride: 99 mmol/L (ref 98–111)
Creatinine, Ser: 0.84 mg/dL (ref 0.44–1.00)
GFR, Estimated: >60 mL/min (ref >60)
Glucose, Bld: 114 mg/dL — ABNORMAL HIGH (ref 70–99)
Potassium: 3.9 mmol/L (ref 3.5–5.1)
Sodium: 133 mmol/L — ABNORMAL LOW (ref 135–145)
Total Bilirubin: 1 mg/dL (ref 0.0–1.2)
Total Protein: 7.1 g/dL (ref 6.5–8.1)

## 2024-04-22 LAB — CBC
HCT: 40.7 % (ref 36.0–46.0)
Hemoglobin: 13.5 g/dL (ref 12.0–15.0)
MCH: 30.5 pg (ref 26.0–34.0)
MCHC: 33.2 g/dL (ref 30.0–36.0)
MCV: 91.9 fL (ref 80.0–100.0)
Platelets: 227 10*3/uL (ref 150–400)
RBC: 4.43 MIL/uL (ref 3.87–5.11)
RDW: 14.1 % (ref 11.5–15.5)
WBC: 9.4 10*3/uL (ref 4.0–10.5)
nRBC: 0 % (ref 0.0–0.2)

## 2024-04-22 LAB — URINALYSIS, ROUTINE W REFLEX MICROSCOPIC
Bilirubin Urine: NEGATIVE
Glucose, UA: NEGATIVE mg/dL
Hgb urine dipstick: NEGATIVE
Ketones, ur: NEGATIVE mg/dL
Leukocytes,Ua: NEGATIVE
Nitrite: NEGATIVE
Protein, ur: NEGATIVE mg/dL
Specific Gravity, Urine: 1.004 — ABNORMAL LOW (ref 1.005–1.030)
pH: 7 (ref 5.0–8.0)

## 2024-04-22 LAB — LIPASE, BLOOD: Lipase: 31 U/L (ref 11–51)

## 2024-04-22 MED ORDER — POLYETHYLENE GLYCOL 3350 17 G PO PACK: 17.0000 g | PACK | Freq: Every day | ORAL | 0 refills | Status: DC

## 2024-04-22 MED ORDER — MAGNESIUM CITRATE PO SOLN
1.0000 | Freq: Once | ORAL | Status: AC
  Administered 2024-04-22: 1 via ORAL
  Filled 2024-04-22: qty 296

## 2024-04-22 NOTE — ED Provider Notes (Signed)
 [place] EMERGENCY DEPARTMENT AT [facility] Provider Note   CSN: [id number] Arrival date & time: 04/22/24  2595     History  Chief Complaint  Patient presents with   Abdominal Pain    Tina Brooks is a 83 y.o. female.  HPI    83 year old female comes in with chief complaint of abdominal pain. Patient accompanied by son.  Patient has prior history of hyperlipidemia, thyroid  disorder.  She also has previous history of C-section, no other abdominal surgeries.  Patient indicates that she has had some constipation for the last 6 weeks.  However, in the last 3 days she has had some epigastric abdominal discomfort.  Pain is described as sharp pain.  She also has noted that her constipation is worsened.  She did have a bowel movement today, but it was small.  Not currently, with few days ago she was noticing that her stools were " string-like".   More recently, patient also has had some URI like symptoms and was diagnosed with pneumonia and put on doxycycline.  Currently she is comfortable, does not have severe pain.  Home Medications Prior to Admission medications   Medication Sig Start Date End Date Taking? Authorizing Provider  polyethylene glycol (MIRALAX) 17 g packet Take 17 g by mouth daily. 04/22/24  Yes Deatra Face, MD  albuterol (VENTOLIN HFA) 108 (90 Base) MCG/ACT inhaler Inhale 2 puffs into the lungs in the morning, at noon, in the evening, and at bedtime.    [provider]  amitriptyline (ELAVIL) 25 MG tablet Takes 1/2 tab by mouth at bedtime Patient not taking: Reported on 12/19/2023 03/26/20   [provider]  azelastine  (ASTELIN ) 0.1 % nasal spray Place 1 spray into both nostrils 2 (two) times daily. Use in each nostril as directed 03/14/24   Corbin Dess, PA-C  azithromycin  (ZITHROMAX ) 250 MG tablet Take first 2 tablets together, then 1 every day until finished. 03/14/24   Corbin Dess, PA-C  Blood Pressure Monitoring  MISC 1 each by Does not apply route as directed. Adult BP cuff dx: hyertension 06/03/23   Lasalle Pointer, NP  diltiazem  (CARDIZEM ) 30 MG tablet Take 1 tablet (30 mg total) by mouth 2 (two) times daily. 12/01/23   Laurann Pollock, MD  levothyroxine (SYNTHROID) 88 MCG tablet Take 88 mcg by mouth daily. 03/26/20   [provider]  zolpidem (AMBIEN CR) 12.5 MG CR tablet Take 10 mg by mouth at bedtime. 03/26/20   [provider]      Allergies    Amoxicillin and Elemental sulfur    Review of Systems   Review of Systems  All other systems reviewed and are negative.   Physical Exam Updated Vital Signs BP (!) 187/58   Pulse 64   Temp 97.7 F (36.5 C) (Oral)   Resp 19   SpO2 97%  Physical Exam Vitals and nursing note reviewed.  Constitutional:      Appearance: She is well-developed.  HENT:     Head: Atraumatic.  Cardiovascular:     Rate and Rhythm: Normal rate.  Pulmonary:     Effort: Pulmonary effort is normal.  Abdominal:     Tenderness: There is no abdominal tenderness. There is no guarding or rebound. Negative signs include Murphy's sign.  Musculoskeletal:     Cervical back: Normal range of motion and neck supple.  Skin:    General: Skin is warm and dry.  Neurological:     Mental Status: She is alert  and oriented to person, place, and time.     ED Results / Procedures / Treatments   Labs (all labs ordered are listed, but only abnormal results are displayed) Labs Reviewed  COMPREHENSIVE METABOLIC PANEL WITH GFR - Abnormal; Notable for the following components:      Result Value   Sodium 133 (*)    Glucose, Bld 114 (*)    Albumin 3.3 (*)    AST 14 (*)    All other components within normal limits  URINALYSIS, ROUTINE W REFLEX MICROSCOPIC - Abnormal; Notable for the following components:   Color, Urine STRAW (*)    Specific Gravity, Urine 1.004 (*)    All other components within normal limits  LIPASE, BLOOD  CBC    EKG EKG  Interpretation Date/Time:  Sunday Apr 22 2024 08:45:26 EDT Ventricular Rate:  66 PR Interval:  188 QRS Duration:  111 QT Interval:  439 QTC Calculation: 460 R Axis:   71  Text Interpretation: Sinus rhythm Anterior infarct, old Nonspecific T abnormalities, lateral leads No acute changes No significant change since last tracing Confirmed by Deatra Face [phone]) on 04/22/2024 8:52:34 AM  Radiology DG Abdomen Acute W/Chest Result Date: 04/22/2024 CLINICAL DATA:  Abdominal pain and nausea for several days. Constipation. Shortness of breath. Dizziness. EXAM: DG ABDOMEN ACUTE WITH 1 VIEW CHEST COMPARISON:  Chest radiograph on 03/19/2024 FINDINGS: There is no evidence of dilated bowel loops or free intraperitoneal air. No radiopaque calculi identified. Heart size and mediastinal contours are within normal limits. Pulmonary hyperinflation and chronic interstitial prominence are consistent with COPD. Stable biapical pleural-parenchymal scarring. No evidence of acute infiltrate or edema. No pleural effusion. Thoracolumbar spine degenerative changes and scoliosis noted. IMPRESSION: Unremarkable bowel gas pattern. COPD.  No active lung disease. Electronically Signed   By: Marlyce Sine M.D.   On: 04/22/2024 10:06    Procedures Procedures    Medications Ordered in ED Medications  magnesium citrate solution 1 Bottle (1 Bottle Oral Given 04/22/24 1053)    ED Course/ Medical Decision Making/ A&P                                 Medical Decision Making Amount and/or Complexity of Data Reviewed Labs: ordered. Radiology: ordered.  Risk OTC drugs.   This patient presents to the ED with chief complaint(s) of abdominal pain, constipation with pertinent past medical history of C-sections, no other abdominal surgery.patient denies any emesis, diarrhea.  She has had some constipation.  Her abdominal exam was reassuring.  The complaint involves an extensive differential diagnosis and also carries with it a  high risk of complications and morbidity.    The differential diagnosis includes : Constipation, ileus, small bowel obstruction, intra-abdominal tumor, severe electrolyte abnormality. Low suspicion for ACS, UTI, pneumonia  The initial plan is to get basic blood work.  Will also get acute abdominal series.   Additional history obtained: Additional history obtained from family Records reviewed  recent urgent care visit.  There are no CT scans in our system or colonoscopy in our system to review.  Independent labs interpretation:  The following labs were independently interpreted: CBC and CMP is normal.  No elevation in creatinine, electrolytes are fine.  White count is normal.  No profound anemia.  Independent visualization and interpretation of imaging: - I independently visualized the following imaging with scope of interpretation limited to determining acute life threatening conditions related to emergency care: Acute  abdominal series, which revealed no evidence of free air or any significant air-fluid levels.  Treatment and Reassessment: Patient reassessed.  Discussed with her that the x-ray looks overall reassuring. Patient states that she feels like she has to go defecate now.  She did receive mag citrate in the ER. Patient prefers going home before she has a bowel movement.  Advised that she follows up with her PCP.  Will give her MiraLAX, which she will take for the next 3 to 5 days.  If she has a good bowel movement, requested that she continue Colace and make MiraLAX as needed.   I did consider CT abdomen pelvis, however patient had no abdominal tenderness during my evaluation, her labs are reassuring -and I think it is reasonable to assume that she is having some functional constipation.  Patient is reliable, has PCP follow-up available, therefore CT can be ordered if necessary by PCP or if she has a return to the ER.   Final Clinical Impression(s) / ED Diagnoses Final  diagnoses:  Constipation, unspecified constipation type    Rx / DC Orders ED Discharge Orders          Ordered    polyethylene glycol (MIRALAX) 17 g packet  Daily        04/22/24 1213              Deatra Face, MD 04/22/24 1257

## 2024-04-22 NOTE — ED Triage Notes (Addendum)
 Pt from home complains of upper abdominal pain x 3 days. Pt also states that she's been constipated for about six weeks. Last BM yesterday says it was small and not normal. Endorses SOB, sats@ 100% RA, nausea, dizziness and lightheadedness

## 2024-05-30 DIAGNOSIS — D559 Anemia due to enzyme disorder, unspecified: Secondary | ICD-10-CM | POA: Diagnosis not present

## 2024-05-30 DIAGNOSIS — R7303 Prediabetes: Secondary | ICD-10-CM | POA: Diagnosis not present

## 2024-05-30 DIAGNOSIS — E039 Hypothyroidism, unspecified: Secondary | ICD-10-CM | POA: Diagnosis not present

## 2024-05-30 DIAGNOSIS — E559 Vitamin D deficiency, unspecified: Secondary | ICD-10-CM | POA: Diagnosis not present

## 2024-05-30 DIAGNOSIS — D529 Folate deficiency anemia, unspecified: Secondary | ICD-10-CM | POA: Diagnosis not present

## 2024-05-30 DIAGNOSIS — D519 Vitamin B12 deficiency anemia, unspecified: Secondary | ICD-10-CM | POA: Diagnosis not present

## 2024-05-30 DIAGNOSIS — Z13220 Encounter for screening for lipoid disorders: Secondary | ICD-10-CM | POA: Diagnosis not present

## 2024-05-31 ENCOUNTER — Telehealth: Payer: Self-pay | Admitting: Cardiology

## 2024-05-31 NOTE — Telephone Encounter (Signed)
 Pt called in requesting to switch to Dr. Mallipeddi. Is this switch okay?

## 2024-06-06 DIAGNOSIS — Z1331 Encounter for screening for depression: Secondary | ICD-10-CM | POA: Diagnosis not present

## 2024-06-06 DIAGNOSIS — E039 Hypothyroidism, unspecified: Secondary | ICD-10-CM | POA: Diagnosis not present

## 2024-06-06 DIAGNOSIS — Z0001 Encounter for general adult medical examination with abnormal findings: Secondary | ICD-10-CM | POA: Diagnosis not present

## 2024-06-06 DIAGNOSIS — I499 Cardiac arrhythmia, unspecified: Secondary | ICD-10-CM | POA: Diagnosis not present

## 2024-06-06 DIAGNOSIS — R7303 Prediabetes: Secondary | ICD-10-CM | POA: Diagnosis not present

## 2024-06-06 DIAGNOSIS — I351 Nonrheumatic aortic (valve) insufficiency: Secondary | ICD-10-CM | POA: Diagnosis not present

## 2024-06-06 DIAGNOSIS — Z1389 Encounter for screening for other disorder: Secondary | ICD-10-CM | POA: Diagnosis not present

## 2024-06-06 DIAGNOSIS — Z6820 Body mass index (BMI) 20.0-20.9, adult: Secondary | ICD-10-CM | POA: Diagnosis not present

## 2024-06-07 ENCOUNTER — Ambulatory Visit: Payer: Medicare Other | Admitting: Cardiology

## 2024-06-20 ENCOUNTER — Telehealth: Payer: Self-pay | Admitting: *Deleted

## 2024-06-20 ENCOUNTER — Ambulatory Visit (INDEPENDENT_AMBULATORY_CARE_PROVIDER_SITE_OTHER): Admitting: Internal Medicine

## 2024-06-20 ENCOUNTER — Encounter: Payer: Self-pay | Admitting: Internal Medicine

## 2024-06-20 ENCOUNTER — Other Ambulatory Visit: Payer: Self-pay | Admitting: *Deleted

## 2024-06-20 VITALS — BP 180/68 | HR 65 | Temp 97.1°F | Ht 65.0 in | Wt 122.0 lb

## 2024-06-20 DIAGNOSIS — R140 Abdominal distension (gaseous): Secondary | ICD-10-CM | POA: Diagnosis not present

## 2024-06-20 DIAGNOSIS — K5904 Chronic idiopathic constipation: Secondary | ICD-10-CM

## 2024-06-20 DIAGNOSIS — K5900 Constipation, unspecified: Secondary | ICD-10-CM

## 2024-06-20 DIAGNOSIS — R634 Abnormal weight loss: Secondary | ICD-10-CM | POA: Diagnosis not present

## 2024-06-20 DIAGNOSIS — K56609 Unspecified intestinal obstruction, unspecified as to partial versus complete obstruction: Secondary | ICD-10-CM | POA: Diagnosis not present

## 2024-06-20 DIAGNOSIS — K219 Gastro-esophageal reflux disease without esophagitis: Secondary | ICD-10-CM

## 2024-06-20 DIAGNOSIS — K56699 Other intestinal obstruction unspecified as to partial versus complete obstruction: Secondary | ICD-10-CM

## 2024-06-20 NOTE — Progress Notes (Signed)
 Primary Care Physician:  Trudy Vaughn FALCON, MD Primary Gastroenterologist:  Dr. Cindie  Chief Complaint  Patient presents with   New Patient (Initial Visit)    Patient here today due to having issues with with unintentional weight loss, constipation, and early satiety. She says she has noticed the weight loss since 2021. She uses stool softener two po prn, (usually one per week) and when having severe constipation she uses mag citrate. 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    HPI:   Tina Brooks is a 83 y.o. female who presents to the clinic today by referral from her PCP Dr. Trudy for evaluation.  Multiple complaints for me today.  Over the last year she endorses unintentional weight loss.  Reports her appetite is not great.  Today weighs 122 pounds, down from 133 pounds a year ago.  Also complaining of constipation.  This prompted an ER visit 04/22/2024, workup largely unremarkable.  Treated conservatively and discharged home.  Currently taking Colace as needed and mag citrate on top of this if she becomes very constipated.  She states her last colonoscopy was around 10 years ago in [place] .  She states it was an incomplete procedure due to what sounds like a stricture of her colon.  Denies any abdominal pain though does note associated bloating.  Reports very mild acid reflux symptoms which she maintains with diet as well as raising the head of her bed at night.  No dysphagia odynophagia.  Denies any epigastric or chest pain for me.  Past Medical History:  Diagnosis Date   DVT (deep venous thrombosis) (HCC)    RT leg   Giant cell arteritis (HCC)    High cholesterol    Thyroid  disease     Past Surgical History:  Procedure Laterality Date   ABDOMINAL HYSTERECTOMY     BREAST BIOPSY Left    CESAREAN SECTION  1964, 1972   giant cell biopsy     KIDNEY SURGERY Left    left kidney removed   MOUTH SURGERY     NEPHRECTOMY Left 1970   SKIN CANCER  DESTRUCTION     on back and chest   TOE SURGERY Right    nail removed    Current Outpatient Medications  Medication Sig Dispense Refill   albuterol (VENTOLIN HFA) 108 (90 Base) MCG/ACT inhaler Inhale 2 puffs into the lungs in the morning, at noon, in the evening, and at bedtime. (Patient taking differently: Inhale 2 puffs into the lungs at bedtime.)     Blood Pressure Monitoring MISC 1 each by Does not apply route as directed. Adult BP cuff dx: hyertension 1 each 0   docusate sodium (COLACE) 100 MG capsule Take 100 mg by mouth once a week.     levothyroxine (SYNTHROID) 88 MCG tablet Take 88 mcg by mouth daily.     metoprolol tartrate (LOPRESSOR) 50 MG tablet Take 50 mg by mouth 2 (two) times daily.     zolpidem (AMBIEN CR) 12.5 MG CR tablet Take 10 mg by mouth at bedtime.     No current facility-administered medications for this visit.    Allergies as of 06/20/2024 - Review Complete 06/20/2024  Allergen Reaction Noted   Amoxicillin Other (See Comments) 01/31/2018   Elemental sulfur Hives 01/31/2018    Family History  Problem Relation Age of Onset   Heart attack Paternal Grandfather    Tuberculosis Maternal Grandfather    Stroke Father  Stem cell   Aneurysm Mother        brain   Diabetes Brother    Healthy Daughter    Other Son        IV septal defect; pulmonary stenosis    Social History   Socioeconomic History   Marital status: Married    Spouse name: Not on file   Number of children: Not on file   Years of education: Not on file   Highest education level: Not on file  Occupational History   Not on file  Tobacco Use   Smoking status: Former    Types: Cigarettes   Smokeless tobacco: Never  Vaping Use   Vaping status: Never Used  Substance and Sexual Activity   Alcohol  use: No   Drug use: No   Sexual activity: Not Currently    Birth control/protection: Surgical    Comment: hyst  Other Topics Concern   Not on file  Social History Narrative   **  Merged History Encounter **       Social Drivers of Corporate investment banker Strain: Not on file  Food Insecurity: Not on file  Transportation Needs: Not on file  Physical Activity: Not on file  Stress: Not on file  Social Connections: Not on file  Intimate Partner Violence: Not on file    Subjective: Review of Systems  Constitutional:  Positive for weight loss. Negative for chills and fever.  HENT:  Negative for congestion and hearing loss.   Eyes:  Negative for blurred vision and double vision.  Respiratory:  Negative for cough and shortness of breath.   Cardiovascular:  Negative for chest pain and palpitations.  Gastrointestinal:  Positive for constipation and heartburn. Negative for abdominal pain, blood in stool, diarrhea, melena and vomiting.  Genitourinary:  Negative for dysuria and urgency.  Musculoskeletal:  Negative for joint pain and myalgias.  Skin:  Negative for itching and rash.  Neurological:  Negative for dizziness and headaches.  Psychiatric/Behavioral:  Negative for depression. The patient is not nervous/anxious.        Objective: BP (!) 180/68 (BP Location: Right Arm, Patient Position: Sitting, Cuff Size: Normal)   Pulse 65   Temp (!) 97.1 F (36.2 C) (Temporal)   Ht 5' 5 (1.651 m)   Wt 122 lb (55.3 kg)   BMI 20.30 kg/m  Physical Exam Constitutional:      Appearance: Normal appearance.  HENT:     Head: Normocephalic and atraumatic.  Eyes:     Extraocular Movements: Extraocular movements intact.     Conjunctiva/sclera: Conjunctivae normal.  Cardiovascular:     Rate and Rhythm: Normal rate and regular rhythm.     Heart sounds: Murmur heard.  Pulmonary:     Effort: Pulmonary effort is normal.     Breath sounds: Normal breath sounds.  Abdominal:     General: Bowel sounds are normal.     Palpations: Abdomen is soft.  Musculoskeletal:        General: No swelling. Normal range of motion.     Cervical back: Normal range of motion and neck  supple.  Skin:    General: Skin is warm and dry.     Coloration: Skin is not jaundiced.  Neurological:     General: No focal deficit present.     Mental Status: She is alert and oriented to person, place, and time.  Psychiatric:        Mood and Affect: Mood normal.  Behavior: Behavior normal.      Assessment: *Weight loss *Colon stricture *Constipation *Acid reflux *Abdominal bloating  Plan: Discussed in depth with patient today.  She has what sounds like a colon stricture with incomplete colonoscopy 10 years ago.  Offered colonoscopy to further evaluate today.  She would like to hold off on this for now.  Discussed CT abdomen pelvis to further evaluate and she is agreeable.  Will order CT abdomen pelvis with IV and oral contrast.  Call with results.  I am also going to request her colonoscopy report from [facility] .  Acid reflux symptoms very mild, continue to monitor.  For constipation recommend she start taking Colace once daily, titrate up as needed.  Follow-up in 6 weeks.  Thank you Dr. Trudy for the kind referral  06/20/2024 9:22 AM   Disclaimer: This note was dictated with voice recognition software. Similar sounding words can inadvertently be transcribed and may not be corrected upon review.

## 2024-06-20 NOTE — Patient Instructions (Signed)
 I am going to order a CT of your abdomen pelvis to further evaluate your symptoms.  We will call with results.  I am also going to request your colonoscopy report from [facility] .  Follow-up with me in 6 weeks.  It was very nice meeting you today.  Dr. Cindie

## 2024-06-20 NOTE — Telephone Encounter (Signed)
 LMOVM to return call  CT scheduled for 07/05/24, arrive at 11 am to check in to start drinking oral contrast at [facility]

## 2024-06-20 NOTE — Addendum Note (Signed)
 Addended by: GAYLENE MADELIN CROME on: 06/20/2024 10:11 AM   Modules accepted: Orders

## 2024-06-21 NOTE — Telephone Encounter (Signed)
 Pt informed of CT appt date, time and location.

## 2024-07-05 ENCOUNTER — Ambulatory Visit (HOSPITAL_COMMUNITY)
Admission: RE | Admit: 2024-07-05 | Discharge: 2024-07-05 | Disposition: A | Discharge status: Home / Self Care | Source: Ambulatory Visit | Attending: Internal Medicine | Admitting: Internal Medicine

## 2024-07-05 ENCOUNTER — Encounter (HOSPITAL_COMMUNITY): Payer: Self-pay

## 2024-07-05 DIAGNOSIS — K5904 Chronic idiopathic constipation: Secondary | ICD-10-CM | POA: Insufficient documentation

## 2024-07-05 DIAGNOSIS — K5730 Diverticulosis of large intestine without perforation or abscess without bleeding: Secondary | ICD-10-CM | POA: Diagnosis not present

## 2024-07-05 DIAGNOSIS — Z905 Acquired absence of kidney: Secondary | ICD-10-CM | POA: Diagnosis not present

## 2024-07-05 DIAGNOSIS — R140 Abdominal distension (gaseous): Secondary | ICD-10-CM | POA: Diagnosis not present

## 2024-07-05 DIAGNOSIS — K5909 Other constipation: Secondary | ICD-10-CM | POA: Diagnosis not present

## 2024-07-05 LAB — POCT I-STAT CREATININE: Creatinine, Ser: 0.9 mg/dL (ref 0.44–1.00)

## 2024-07-05 MED ORDER — IOHEXOL 300 MG/ML  SOLN
100.0000 mL | Freq: Once | INTRAMUSCULAR | Status: AC | PRN
Start: 2024-07-05 — End: 2024-07-05
  Administered 2024-07-05: 100 mL via INTRAVENOUS

## 2024-07-05 MED ORDER — IOHEXOL 9 MG/ML PO SOLN
ORAL | Status: AC
  Filled 2024-07-05: qty 1000

## 2024-07-16 ENCOUNTER — Telehealth: Payer: Self-pay

## 2024-07-16 NOTE — Telephone Encounter (Signed)
 Sent Dr Cindie a message regarding the pt wanting the results of her CT scan. Waiting on a response

## 2024-07-16 NOTE — Telephone Encounter (Signed)
 Her ct showed small gallstones otherwise unremarkable  (Report from Dr Cindie to advise the pt).  Phoned the pt and advised of her report. Pt expressed understanding.

## 2024-07-24 ENCOUNTER — Telehealth: Payer: Self-pay

## 2024-07-24 NOTE — Telephone Encounter (Signed)
 Pt was seen in July 2nd and she was to follow up in 6 weeks with Dr Cindie. I know he is booked but according to her US  she may have tiny gallstones and she will need to be referred out to a Development worker, international aid.   Already advised pt what to do for her constipation. Pt had stopped taking her colace after a short time so I advise her to take her colace as instructed 1 to 2 tabs a day with plenty of fluids. Pt agreed to do so. Pt was advised that Dr Cindie was on vacation this week. Please let me know regarding an appt

## 2024-07-25 NOTE — Telephone Encounter (Signed)
 Noted

## 2024-07-26 ENCOUNTER — Other Ambulatory Visit (HOSPITAL_COMMUNITY)

## 2024-08-15 ENCOUNTER — Ambulatory Visit (INDEPENDENT_AMBULATORY_CARE_PROVIDER_SITE_OTHER): Admitting: Gastroenterology

## 2024-08-15 ENCOUNTER — Encounter: Payer: Self-pay | Admitting: Gastroenterology

## 2024-08-15 VITALS — BP 158/66 | HR 62 | Temp 97.5°F | Ht 65.0 in | Wt 122.4 lb

## 2024-08-15 DIAGNOSIS — K5909 Other constipation: Secondary | ICD-10-CM

## 2024-08-15 DIAGNOSIS — K5900 Constipation, unspecified: Secondary | ICD-10-CM | POA: Insufficient documentation

## 2024-08-15 MED ORDER — TRULANCE 3 MG PO TABS: 3.0000 mg | ORAL_TABLET | Freq: Every day | ORAL | 5 refills | Status: AC

## 2024-08-15 NOTE — Progress Notes (Signed)
 GI Office Note    Referring Provider: Trudy Vaughn FALCON, MD Primary Care Physician:  Trudy Vaughn FALCON, MD  Primary Gastroenterologist: Carlin POUR. Cindie, DO   Chief Complaint   Chief Complaint  Patient presents with   Follow-up    States that she has been having issues with constipation.    History of Present Illness   Tina Brooks is a 83 y.o. female presenting today for follow up. Last seen 06/2024 for constipation, bloating, gerd, weight loss.   Ct completed since last visit as outlined below. She reports last colonoscopy around 10 years ago in [facility], incomplete due to stricture of the colon. She states her provider advised not to have another colonoscopy. She declines colonoscopy at this time.    Discussed the use of AI scribe software for clinical note transcription with the patient, who gave verbal consent to proceed.    She has experienced chronic constipation since March 2025, following a respiratory infection treated with antibiotics. Bowel movements are infrequent and incomplete, often requiring magnesium  citrate for relief. Without intervention, she can go up to seven days without a bowel movement, causing significant discomfort. No melena, brbpr. No vomiting. She has occasional gas and abdominal pain which she feels is related to the constiaption.  Dietary changes, such as increasing fiber intake through foods like cabbage, previously helped but no longer provide relief. She has used Colace, a stool softener, with minimal success. Her brother recommended Metamucil, but she has not yet tried it. Her current regimen includes taking 5 to 6 ounces of magnesium  citrate as needed, which she finds effective.  She drinks six to eight 16-ounce glasses of water daily. She notes that she does not eat adequate fiber. Mostly eats sandwiches, hamburgers, sometimes salads with meat if she eats out.      Wt Readings from Last 3 Encounters:  08/15/24 122 lb 6.4 oz (55.5  kg)  06/20/24 122 lb (55.3 kg)  12/01/23 131 lb 6.4 oz (59.6 kg)     Prior Data   CT A/P with contrast 06/2024:  IMPRESSION: No acute findings.   Colonic diverticulosis, without radiographic evidence of diverticulitis.   Probable tiny gallstones. No radiographic evidence of cholecystitis or biliary dilatation.     Medications   Current Outpatient Medications  Medication Sig Dispense Refill   albuterol (VENTOLIN HFA) 108 (90 Base) MCG/ACT inhaler Inhale 2 puffs into the lungs in the morning, at noon, in the evening, and at bedtime. (Patient taking differently: Inhale 2 puffs into the lungs at bedtime.)     Blood Pressure Monitoring MISC 1 each by Does not apply route as directed. Adult BP cuff dx: hyertension 1 each 0   docusate sodium (COLACE) 100 MG capsule Take 100 mg by mouth once a week.     levothyroxine (SYNTHROID) 88 MCG tablet Take 88 mcg by mouth daily.     metoprolol tartrate (LOPRESSOR) 50 MG tablet Take 50 mg by mouth 2 (two) times daily.     zolpidem (AMBIEN CR) 12.5 MG CR tablet Take 10 mg by mouth at bedtime.     No current facility-administered medications for this visit.    Allergies   Allergies as of 08/15/2024 - Review Complete 08/15/2024  Allergen Reaction Noted   Amoxicillin Other (See Comments) 01/31/2018   Elemental sulfur Hives 01/31/2018         Review of Systems   General: Negative for anorexia, weight loss, fever, chills, fatigue, weakness. No weight loss since last  ov. ENT: Negative for hoarseness, difficulty swallowing , nasal congestion. CV: Negative for chest pain, angina, palpitations, dyspnea on exertion, peripheral edema.  Respiratory: Negative for dyspnea at rest, dyspnea on exertion, cough, sputum, wheezing.  GI: See history of present illness. GU:  Negative for dysuria, hematuria, urinary incontinence, urinary frequency, nocturnal urination.  Endo: Negative for unusual weight change.     Physical Exam   BP (!) 158/66 (BP  Location: Right Arm, Patient Position: Sitting, Cuff Size: Normal)   Pulse 62   Temp (!) 97.5 F (36.4 C) (Oral)   Ht 5' 5 (1.651 m)   Wt 122 lb 6.4 oz (55.5 kg)   SpO2 95%   BMI 20.37 kg/m    General: Well-nourished, well-developed in no acute distress.  Eyes: No icterus. Mouth: Oropharyngeal mucosa moist and pink   Lungs: Clear to auscultation bilaterally.  Heart: Regular rate and rhythm, no murmurs rubs or gallops.  Abdomen: Bowel sounds are normal, nontender, nondistended, no hepatosplenomegaly or masses,  no abdominal bruits or hernia , no rebound or guarding.  Rectal: not performed Extremities: No lower extremity edema. No clubbing or deformities. Neuro: Alert and oriented x 4   Skin: Warm and dry, no jaundice.   Psych: Alert and cooperative, normal mood and affect.  Labs   Lab Results  Component Value Date   NA 133 (L) 04/22/2024   CL 99 04/22/2024   K 3.9 04/22/2024   CO2 25 04/22/2024   BUN 13 04/22/2024   CREATININE 0.90 07/05/2024   GFRNONAA >60 04/22/2024   CALCIUM 9.1 04/22/2024   ALBUMIN 3.3 (L) 04/22/2024   GLUCOSE 114 (H) 04/22/2024   Lab Results  Component Value Date   ALT 11 04/22/2024   AST 14 (L) 04/22/2024   ALKPHOS 76 04/22/2024   BILITOT 1.0 04/22/2024   Lab Results  Component Value Date   WBC 9.4 04/22/2024   HGB 13.5 04/22/2024   HCT 40.7 04/22/2024   MCV 91.9 04/22/2024   PLT 227 04/22/2024   Lab Results  Component Value Date   LIPASE 31 04/22/2024    Imaging Studies   No results found.  Assessment/Plan:       Chronic constipation Chronic constipation since March, after URI. CT without obstruction or findings to explain change in bowels. She has history of incomplete colonoscopy and by description scopy could not be advanced, likely kink/looping, etc. She declines colonoscopy.  -Trulance  3mg  daily. -stop colace - Advise to avoid regular use of magnesium  citrate due to potential kidney risks. -increase dietary fiber. Add  2 servings of fiber daily, gradually increase to 4 servings daily over next 3-4 weeks.  -call in 4 weeks with progress report.    Cholelithiasis without cholecystitis or obstruction Presence of probable small gallstones identified on CT scan. No current symptoms of cholecystitis or obstruction.  - monitor for related symptoms - low fat diet     Sonny RAMAN. Ezzard, MHS, PA-C [facility]

## 2024-08-15 NOTE — Patient Instructions (Addendum)
 Start Trulance  3 mg daily for constipation. Initially you may have loose or watery stools. If you are able to stick with the medication, this will settle down in about a week to a more normal stool. We have provided samples today for you to try. I have also sent in prescription to your pharmacy.   You can stop colace.  Continue drinking plenty of water, you are doing a great job!  Try to add more fiber to your diet. I am included high fiber diet information. You should start slow to avoid bloating. Try adding 2 servings of fiber daily, with goal of increasing to 4 servings per day over the next 3-4 weeks.   Please reach out in 4 weeks and let me know how you are doing. Please call sooner if needed!

## 2024-08-23 DIAGNOSIS — Z85828 Personal history of other malignant neoplasm of skin: Secondary | ICD-10-CM | POA: Diagnosis not present

## 2024-08-23 DIAGNOSIS — L821 Other seborrheic keratosis: Secondary | ICD-10-CM | POA: Diagnosis not present

## 2024-08-23 DIAGNOSIS — L570 Actinic keratosis: Secondary | ICD-10-CM | POA: Diagnosis not present

## 2024-08-23 DIAGNOSIS — D225 Melanocytic nevi of trunk: Secondary | ICD-10-CM | POA: Diagnosis not present

## 2024-08-23 DIAGNOSIS — Z08 Encounter for follow-up examination after completed treatment for malignant neoplasm: Secondary | ICD-10-CM | POA: Diagnosis not present

## 2024-08-23 DIAGNOSIS — L814 Other melanin hyperpigmentation: Secondary | ICD-10-CM | POA: Diagnosis not present

## 2024-08-30 DIAGNOSIS — R7303 Prediabetes: Secondary | ICD-10-CM | POA: Diagnosis not present

## 2024-08-30 DIAGNOSIS — D559 Anemia due to enzyme disorder, unspecified: Secondary | ICD-10-CM | POA: Diagnosis not present

## 2024-10-11 DIAGNOSIS — I499 Cardiac arrhythmia, unspecified: Secondary | ICD-10-CM | POA: Diagnosis not present

## 2024-10-11 DIAGNOSIS — R634 Abnormal weight loss: Secondary | ICD-10-CM | POA: Diagnosis not present

## 2024-10-11 DIAGNOSIS — E039 Hypothyroidism, unspecified: Secondary | ICD-10-CM | POA: Diagnosis not present

## 2024-10-11 DIAGNOSIS — M419 Scoliosis, unspecified: Secondary | ICD-10-CM | POA: Diagnosis not present

## 2024-10-11 DIAGNOSIS — Z6820 Body mass index (BMI) 20.0-20.9, adult: Secondary | ICD-10-CM | POA: Diagnosis not present

## 2024-10-11 DIAGNOSIS — I351 Nonrheumatic aortic (valve) insufficiency: Secondary | ICD-10-CM | POA: Diagnosis not present

## 2024-10-11 DIAGNOSIS — R7303 Prediabetes: Secondary | ICD-10-CM | POA: Diagnosis not present

## 2024-10-11 DIAGNOSIS — E871 Hypo-osmolality and hyponatremia: Secondary | ICD-10-CM | POA: Diagnosis not present

## 2024-10-11 DIAGNOSIS — G4700 Insomnia, unspecified: Secondary | ICD-10-CM | POA: Diagnosis not present

## 2024-10-11 DIAGNOSIS — M810 Age-related osteoporosis without current pathological fracture: Secondary | ICD-10-CM | POA: Diagnosis not present
